# Patient Record
Sex: Female | Born: 1985 | Race: Black or African American | Hispanic: No | Marital: Married | Smoking: Never smoker
Health system: Southern US, Community
[De-identification: ages and names within clinical notes are randomized; demographics above are authoritative.]

## PROBLEM LIST (undated history)

## (undated) DIAGNOSIS — D219 Benign neoplasm of connective and other soft tissue, unspecified: Secondary | ICD-10-CM

## (undated) DIAGNOSIS — D649 Anemia, unspecified: Secondary | ICD-10-CM

## (undated) DIAGNOSIS — O24419 Gestational diabetes mellitus in pregnancy, unspecified control: Secondary | ICD-10-CM

## (undated) DIAGNOSIS — I10 Essential (primary) hypertension: Secondary | ICD-10-CM

## (undated) HISTORY — PX: NO PAST SURGERIES: SHX2092

## (undated) HISTORY — DX: Essential (primary) hypertension: I10

---

## 2021-05-04 ENCOUNTER — Ambulatory Visit (INDEPENDENT_AMBULATORY_CARE_PROVIDER_SITE_OTHER): Payer: Self-pay | Admitting: Obstetrics and Gynecology

## 2021-05-04 ENCOUNTER — Other Ambulatory Visit (HOSPITAL_COMMUNITY)
Admission: RE | Admit: 2021-05-04 | Discharge: 2021-05-04 | Disposition: A | Discharge status: Home / Self Care | Payer: PRIVATE HEALTH INSURANCE | Source: Ambulatory Visit | Attending: Obstetrics and Gynecology | Admitting: Obstetrics and Gynecology

## 2021-05-04 ENCOUNTER — Encounter: Payer: Self-pay | Admitting: Obstetrics and Gynecology

## 2021-05-04 ENCOUNTER — Other Ambulatory Visit: Payer: Self-pay

## 2021-05-04 DIAGNOSIS — Z3480 Encounter for supervision of other normal pregnancy, unspecified trimester: Secondary | ICD-10-CM | POA: Insufficient documentation

## 2021-05-04 DIAGNOSIS — Z3403 Encounter for supervision of normal first pregnancy, third trimester: Secondary | ICD-10-CM | POA: Insufficient documentation

## 2021-05-04 DIAGNOSIS — O99011 Anemia complicating pregnancy, first trimester: Secondary | ICD-10-CM

## 2021-05-04 NOTE — Progress Notes (Signed)
  Subjective:    Caitlin Gutierrez is a G1P0 1w1dbeing seen today for her first obstetrical visit.  Her obstetrical history is significant for first pregnancy. Patient does intend to breast feed. Pregnancy history fully reviewed.  Patient reports no complaints.  Vitals:   05/04/21 0910 05/04/21 0911  BP: 120/81   Pulse: 88   Weight: 177 lb (80.3 kg)   Height:  '5\' 6"'$  (1.676 m)    HISTORY: OB History  Gravida Para Term Preterm AB Living  1            SAB IAB Ectopic Multiple Live Births               # Outcome Date GA Lbr Len/2nd Weight Sex Delivery Anes PTL Lv  1 Current            History reviewed. No pertinent past medical history. Past Surgical History:  Procedure Laterality Date   NO PAST SURGERIES     Family History  Problem Relation Age of Onset   Hypertension Father      Exam    Uterus:     Pelvic Exam:    Perineum: No Hemorrhoids, Normal Perineum   Vulva: normal   Vagina:  normal mucosa, normal discharge   pH:    Cervix: multiparous appearance and closed and long   Adnexa: no mass, fullness, tenderness   Bony Pelvis: gynecoid  System: Breast:  normal appearance, no masses or tenderness   Skin: normal coloration and turgor, no rashes    Neurologic: oriented, no focal deficits   Extremities: normal strength, tone, and muscle mass   HEENT extra ocular movement intact   Mouth/Teeth mucous membranes moist, pharynx normal without lesions and dental hygiene good   Neck supple and no masses   Cardiovascular: regular rate and rhythm   Respiratory:  appears well, vitals normal, no respiratory distress, acyanotic, normal RR, chest clear, no wheezing, crepitations, rhonchi, normal symmetric air entry   Abdomen: soft, non-tender; bowel sounds normal; no masses,  no organomegaly   Urinary:       Assessment:    Pregnancy: G1P0 Patient Active Problem List   Diagnosis Date Noted   Supervision of other normal pregnancy, antepartum 05/04/2021        Plan:      Initial labs drawn. Prenatal vitamins. Problem list reviewed and updated. Genetic Screening discussed : panorama ordered.  Ultrasound discussed; fetal survey: ordered.  Follow up in 4 weeks. 50% of 30 min visit spent on counseling and coordination of care.     Caitlin Gutierrez 05/04/2021

## 2021-05-04 NOTE — Patient Instructions (Signed)
First Trimester of Pregnancy The first trimester of pregnancy starts on the first day of your last menstrual period until the end of week 12. This is months 1 through 3 of pregnancy. A week after a sperm fertilizes an egg, the egg will implant into the wall of the uterus and begin to develop into a baby. By the end of 12 weeks, all the baby's organs will be formed and the baby will be 2-3 inches in size. Body changes during your first trimester Your body goes through many changes during pregnancy. The changes vary and generally return to normal after your baby is born. Physical changes You may gain or lose weight. Your breasts may begin to grow larger and become tender. The tissue that surrounds your nipples (areola) may become darker. Dark spots or blotches (chloasma or mask of pregnancy) may develop on your face. You may have changes in your hair. These can include thickening or thinning of your hair or changes in texture. Health changes You may feel nauseous, and you may vomit. You may have heartburn. You may develop headaches. You may develop constipation. Your gums may bleed and may be sensitive to brushing and flossing. Other changes You may tire easily. You may urinate more often. Your menstrual periods will stop. You may have a loss of appetite. You may develop cravings for certain kinds of food. You may have changes in your emotions from day to day. You may have more vivid and strange dreams. Follow these instructions at home: Medicines Follow your health care provider's instructions regarding medicine use. Specific medicines may be either safe or unsafe to take during pregnancy. Do not take any medicines unless told to by your health care provider. Take a prenatal vitamin that contains at least 600 micrograms (mcg) of folic acid. Eating and drinking Eat a healthy diet that includes fresh fruits and vegetables, whole grains, good sources of protein such as meat, eggs, or tofu,  and low-fat dairy products. Avoid raw meat and unpasteurized juice, milk, and cheese. These carry germs that can harm you and your baby. If you feel nauseous or you vomit: Eat 4 or 5 small meals a day instead of 3 large meals. Try eating a few soda crackers. Drink liquids between meals instead of during meals. You may need to take these actions to prevent or treat constipation: Drink enough fluid to keep your urine pale yellow. Eat foods that are high in fiber, such as beans, whole grains, and fresh fruits and vegetables. Limit foods that are high in fat and processed sugars, such as fried or sweet foods. Activity Exercise only as directed by your health care provider. Most people can continue their usual exercise routine during pregnancy. Try to exercise for 30 minutes at least 5 days a week. Stop exercising if you develop pain or cramping in the lower abdomen or lower back. Avoid exercising if it is very hot or humid or if you are at high altitude. Avoid heavy lifting. If you choose to, you may have sex unless your health care provider tells you not to. Relieving pain and discomfort Wear a good support bra to relieve breast tenderness. Rest with your legs elevated if you have leg cramps or low back pain. If you develop bulging veins (varicose veins) in your legs: Wear support hose as told by your health care provider. Elevate your feet for 15 minutes, 3-4 times a day. Limit salt in your diet. Safety Wear your seat belt at all times when driving  or riding in a car. Talk with your health care provider if someone is verbally or physically abusive to you. Talk with your health care provider if you are feeling sad or have thoughts of hurting yourself. Lifestyle Do not use hot tubs, steam rooms, or saunas. Do not douche. Do not use tampons or scented sanitary pads. Do not use herbal remedies, alcohol, illegal drugs, or medicines that are not approved by your health care provider. Chemicals  in these products can harm your baby. Do not use any products that contain nicotine or tobacco, such as cigarettes, e-cigarettes, and chewing tobacco. If you need help quitting, ask your health care provider. Avoid cat litter boxes and soil used by cats. These carry germs that can cause birth defects in the baby and possibly loss of the unborn baby (fetus) by miscarriage or stillbirth. General instructions During routine prenatal visits in the first trimester, your health care provider will do a physical exam, perform necessary tests, and ask you how things are going. Keep all follow-up visits. This is important. Ask for help if you have counseling or nutritional needs during pregnancy. Your health care provider can offer advice or refer you to specialists for help with various needs. Schedule a dentist appointment. At home, brush your teeth with a soft toothbrush. Floss gently. Write down your questions. Take them to your prenatal visits. Where to find more information American Pregnancy Association: americanpregnancy.[place] and Gynecologists: PoolDevices.com.pt Office on [employer] Health: KeywordPortfolios.com.br Contact a health care provider if you have: Dizziness. A fever. Mild pelvic cramps, pelvic pressure, or nagging pain in the abdominal area. Nausea, vomiting, or diarrhea that lasts for 24 hours or longer. A bad-smelling vaginal discharge. Pain when you urinate. Known exposure to a contagious illness, such as chickenpox, measles, Zika virus, HIV, or hepatitis. Get help right away if you have: Spotting or bleeding from your vagina. Severe abdominal cramping or pain. Shortness of breath or chest pain. Any kind of trauma, such as from a fall or a car crash. New or increased pain, swelling, or redness in an arm or leg. Summary The first trimester of pregnancy starts on the first day of your last menstrual period until the end of week  12 (months 1 through 3). Eating 4 or 5 small meals a day rather than 3 large meals may help to relieve nausea and vomiting. Do not use any products that contain nicotine or tobacco, such as cigarettes, e-cigarettes, and chewing tobacco. If you need help quitting, ask your health care provider. Keep all follow-up visits. This is important. This information is not intended to replace advice given to you by your health care provider. Make sure you discuss any questions you have with your health care provider. Document Revised: 01/16/2020 Document Reviewed: 11/22/2019 Elsevier Patient Education  2022 [place] of Pregnancy The second trimester of pregnancy is from week 13 through week 27. This is months 4 through 6 of pregnancy. The second trimester is often a time when you feel your best. Your body has adjusted to being pregnant, and you begin to feel better physically. During the second trimester: Morning sickness has lessened or stopped completely. You may have more energy. You may have an increase in appetite. The second trimester is also a time when the unborn baby (fetus) is growing rapidly. At the end of the sixth month, the fetus may be up to 12 inches long and weigh about 1 pounds. You will likely begin to  feel the baby move (quickening) between 16 and 20 weeks of pregnancy. Body changes during your second trimester Your body continues to go through many changes during your second trimester. The changes vary and generally return to normal after the baby is born. Physical changes Your weight will continue to increase. You will notice your lower abdomen bulging out. You may begin to get stretch marks on your hips, abdomen, and breasts. Your breasts will continue to grow and to become tender. Dark spots or blotches (chloasma or mask of pregnancy) may develop on your face. A dark line from your belly button to the pubic area (linea nigra) may appear. You may have  changes in your hair. These can include thickening of your hair, rapid growth, and changes in texture. Some people also have hair loss during or after pregnancy, or hair that feels dry or thin. Health changes You may develop headaches. You may have heartburn. You may develop constipation. You may develop hemorrhoids or swollen, bulging veins (varicose veins). Your gums may bleed and may be sensitive to brushing and flossing. You may urinate more often because the fetus is pressing on your bladder. You may have back pain. This is caused by: Weight gain. Pregnancy hormones that are relaxing the joints in your pelvis. A shift in weight and the muscles that support your balance. Follow these instructions at home: Medicines Follow your health care provider's instructions regarding medicine use. Specific medicines may be either safe or unsafe to take during pregnancy. Do not take any medicines unless approved by your health care provider. Take a prenatal vitamin that contains at least 600 micrograms (mcg) of folic acid. Eating and drinking Eat a healthy diet that includes fresh fruits and vegetables, whole grains, good sources of protein such as meat, eggs, or tofu, and low-fat dairy products. Avoid raw meat and unpasteurized juice, milk, and cheese. These carry germs that can harm you and your baby. You may need to take these actions to prevent or treat constipation: Drink enough fluid to keep your urine pale yellow. Eat foods that are high in fiber, such as beans, whole grains, and fresh fruits and vegetables. Limit foods that are high in fat and processed sugars, such as fried or sweet foods. Activity Exercise only as directed by your health care provider. Most people can continue their usual exercise routine during pregnancy. Try to exercise for 30 minutes at least 5 days a week. Stop exercising if you develop contractions in your uterus. Stop exercising if you develop pain or cramping in the  lower abdomen or lower back. Avoid exercising if it is very hot or humid or if you are at a high altitude. Avoid heavy lifting. If you choose to, you may have sex unless your health care provider tells you not to. Relieving pain and discomfort Wear a supportive bra to prevent discomfort from breast tenderness. Take warm sitz baths to soothe any pain or discomfort caused by hemorrhoids. Use hemorrhoid cream if your health care provider approves. Rest with your legs raised (elevated) if you have leg cramps or low back pain. If you develop varicose veins: Wear support hose as told by your health care provider. Elevate your feet for 15 minutes, 3-4 times a day. Limit salt in your diet. Safety Wear your seat belt at all times when driving or riding in a car. Talk with your health care provider if someone is verbally or physically abusive to you. Lifestyle Do not use hot tubs, steam rooms, or saunas.  Do not douche. Do not use tampons or scented sanitary pads. Avoid cat litter boxes and soil used by cats. These carry germs that can cause birth defects in the baby and possibly loss of the fetus by miscarriage or stillbirth. Do not use herbal remedies, alcohol, illegal drugs, or medicines that are not approved by your health care provider. Chemicals in these products can harm your baby. Do not use any products that contain nicotine or tobacco, such as cigarettes, e-cigarettes, and chewing tobacco. If you need help quitting, ask your health care provider. General instructions During a routine prenatal visit, your health care provider will do a physical exam and other tests. He or she will also discuss your overall health. Keep all follow-up visits. This is important. Ask your health care provider for a referral to a local prenatal education class. Ask for help if you have counseling or nutritional needs during pregnancy. Your health care provider can offer advice or refer you to specialists for help  with various needs. Where to find more information American Pregnancy Association: americanpregnancy.[place] and Gynecologists: PoolDevices.com.pt Office on [employer] Health: KeywordPortfolios.com.br Contact a health care provider if you have: A headache that does not go away when you take medicine. Vision changes or you see spots in front of your eyes. Mild pelvic cramps, pelvic pressure, or nagging pain in the abdominal area. Persistent nausea, vomiting, or diarrhea. A bad-smelling vaginal discharge or foul-smelling urine. Pain when you urinate. Sudden or extreme swelling of your face, hands, ankles, feet, or legs. A fever. Get help right away if you: Have fluid leaking from your vagina. Have spotting or bleeding from your vagina. Have severe abdominal cramping or pain. Have difficulty breathing. Have chest pain. Have fainting spells. Have not felt your baby move for the time period told by your health care provider. Have new or increased pain, swelling, or redness in an arm or leg. Summary The second trimester of pregnancy is from week 13 through week 27 (months 4 through 6). Do not use herbal remedies, alcohol, illegal drugs, or medicines that are not approved by your health care provider. Chemicals in these products can harm your baby. Exercise only as directed by your health care provider. Most people can continue their usual exercise routine during pregnancy. Keep all follow-up visits. This is important. This information is not intended to replace advice given to you by your health care provider. Make sure you discuss any questions you have with your health care provider. Document Revised: 01/16/2020 Document Reviewed: 11/22/2019 Elsevier Patient Education  2022 [employer].

## 2021-05-04 NOTE — Progress Notes (Signed)
Pt presents today for initial prenatal visit. No complaints. PHQ 9: 0, GAD 7: 0

## 2021-05-05 LAB — CBC/D/PLT+RPR+RH+ABO+RUBIGG...
Antibody Screen: NEGATIVE
Basophils Absolute: 0 10*3/uL (ref 0.0–0.2)
Basos: 0 %
EOS (ABSOLUTE): 0.1 10*3/uL (ref 0.0–0.4)
Eos: 1 %
HCV Ab: 0.1 s/co ratio (ref 0.0–0.9)
HIV Screen 4th Generation wRfx: NONREACTIVE
Hematocrit: 28.8 % — ABNORMAL LOW (ref 34.0–46.6)
Hemoglobin: 9.8 g/dL — ABNORMAL LOW (ref 11.1–15.9)
Hepatitis B Surface Ag: NEGATIVE
Immature Grans (Abs): 0 10*3/uL (ref 0.0–0.1)
Immature Granulocytes: 0 %
Lymphocytes Absolute: 1.7 10*3/uL (ref 0.7–3.1)
Lymphs: 33 %
MCH: 25.7 pg — ABNORMAL LOW (ref 26.6–33.0)
MCHC: 34 g/dL (ref 31.5–35.7)
MCV: 76 fL — ABNORMAL LOW (ref 79–97)
Monocytes Absolute: 0.6 10*3/uL (ref 0.1–0.9)
Monocytes: 11 %
Neutrophils Absolute: 2.8 10*3/uL (ref 1.4–7.0)
Neutrophils: 55 %
Platelets: 307 10*3/uL (ref 150–450)
RBC: 3.81 x10E6/uL (ref 3.77–5.28)
RDW: 15.5 % — ABNORMAL HIGH (ref 11.7–15.4)
RPR Ser Ql: NONREACTIVE
Rh Factor: POSITIVE
Rubella Antibodies, IGG: 2.91 index (ref >0.99)
WBC: 5.1 10*3/uL (ref 3.4–10.8)

## 2021-05-05 LAB — HCV INTERPRETATION

## 2021-05-06 DIAGNOSIS — O99013 Anemia complicating pregnancy, third trimester: Secondary | ICD-10-CM | POA: Insufficient documentation

## 2021-05-06 DIAGNOSIS — O99011 Anemia complicating pregnancy, first trimester: Secondary | ICD-10-CM | POA: Insufficient documentation

## 2021-05-06 LAB — CULTURE, OB URINE

## 2021-05-06 LAB — CERVICOVAGINAL ANCILLARY ONLY
Chlamydia: NEGATIVE
Comment: NEGATIVE
Comment: NEGATIVE
Comment: NORMAL
Neisseria Gonorrhea: NEGATIVE
Trichomonas: NEGATIVE

## 2021-05-06 LAB — URINE CULTURE, OB REFLEX

## 2021-05-06 MED ORDER — FERROUS SULFATE 325 (65 FE) MG PO TABS: 325.0000 mg | ORAL_TABLET | ORAL | 3 refills | Status: DC

## 2021-05-06 NOTE — Addendum Note (Signed)
Addended by: Verita Schneiders A on: 05/06/2021 11:54 AM   Modules accepted: Orders

## 2021-05-07 LAB — CYTOLOGY - PAP
Adequacy: ABSENT
Comment: NEGATIVE
Diagnosis: NEGATIVE
High risk HPV: NEGATIVE

## 2021-05-07 NOTE — Progress Notes (Signed)
TC to patient to inform of anemia and of RX for iron sent to pharmacy. Patient verbalized understanding.

## 2021-05-11 ENCOUNTER — Encounter: Payer: Self-pay | Admitting: Obstetrics and Gynecology

## 2021-05-18 ENCOUNTER — Encounter: Payer: Self-pay | Admitting: Obstetrics and Gynecology

## 2021-05-19 ENCOUNTER — Telehealth: Payer: Self-pay

## 2021-05-19 NOTE — Telephone Encounter (Signed)
[employer] breast pump form signed and faxed.  Confirmation received.

## 2021-06-02 ENCOUNTER — Other Ambulatory Visit: Payer: Self-pay

## 2021-06-02 ENCOUNTER — Ambulatory Visit (INDEPENDENT_AMBULATORY_CARE_PROVIDER_SITE_OTHER): Payer: PRIVATE HEALTH INSURANCE

## 2021-06-02 VITALS — BP 120/68 | HR 92 | Wt 179.0 lb

## 2021-06-02 DIAGNOSIS — Z23 Encounter for immunization: Secondary | ICD-10-CM

## 2021-06-02 DIAGNOSIS — O26892 Other specified pregnancy related conditions, second trimester: Secondary | ICD-10-CM

## 2021-06-02 DIAGNOSIS — Z3480 Encounter for supervision of other normal pregnancy, unspecified trimester: Secondary | ICD-10-CM

## 2021-06-02 DIAGNOSIS — D563 Thalassemia minor: Secondary | ICD-10-CM

## 2021-06-02 DIAGNOSIS — Z3A17 17 weeks gestation of pregnancy: Secondary | ICD-10-CM

## 2021-06-02 NOTE — Addendum Note (Signed)
Addended by: Tamela Oddi on: 06/02/2021 03:23 PM   Modules accepted: Orders

## 2021-06-02 NOTE — Progress Notes (Signed)
ROB c/o pain in knees and hip at night, spotting in the Mornings.

## 2021-06-02 NOTE — Progress Notes (Signed)
   PRENATAL VISIT NOTE  Subjective:  Caitlin Gutierrez is a 35 y.o. G1P0 at [redacted]w[redacted]d being seen today for ongoing prenatal care.  She is currently monitored for the following issues for this low-risk pregnancy and has Supervision of other normal pregnancy, antepartum and Anemia affecting pregnancy in first trimester on their problem list.  Patient reports bilateral hip and knee pain only at night for the past 2 weeks. Sleeping on her sides makes the pain worse. She has been using knee brace, which helps with knee pain while she is wearing.  Contractions: Not present. Vag. Bleeding: Scant.   . Denies leaking of fluid.   The following portions of the patient's history were reviewed and updated as appropriate: allergies, current medications, past family history, past medical history, past social history, past surgical history and problem list.   Objective:   Vitals:   06/02/21 1346  BP: 120/68  Pulse: 92  Weight: 179 lb (81.2 kg)    Fetal Status: Fetal Heart Rate (bpm): 158         General:  Alert, oriented and cooperative. Patient is in no acute distress.  Skin: Skin is warm and dry. No rash noted.   Cardiovascular: Normal heart rate noted  Respiratory: Normal respiratory effort, no problems with respiration noted  Abdomen: Soft, gravid, appropriate for gestational age.  Pain/Pressure: Absent     Pelvic: Cervical exam deferred        Extremities: Normal range of motion.  Edema: None  Mental Status: Normal mood and affect. Normal behavior. Normal judgment and thought content.   Assessment and Plan:  Pregnancy: G1P0 at [redacted]w[redacted]d 1. Supervision of other normal pregnancy, antepartum - Routine OB care - Reassurance provided on normal discomforts of pregnancy. Recommend continue use of knee brace if it helps. May use ice for knee pain. Support belt, stretching, hydration for hip pain. Tylenol prn. 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 Alpha thalassemia silent carrier   Preterm labor symptoms and general obstetric precautions including but not limited to vaginal bleeding, contractions, leaking of fluid and fetal movement were reviewed in detail with the patient. Please refer to After Visit Summary for other counseling recommendations.   Return in about 4 weeks (around 06/30/2021).  Future Appointments  Date Time Provider Roy  06/15/2021  2:15 PM [facility] [place], CNM 06/02/21 2:43 PM

## 2021-06-04 LAB — AFP, SERUM, OPEN SPINA BIFIDA
AFP MoM: 0.69
AFP Value: 27.8 ng/mL
Gest. Age on Collection Date: 17.2 weeks
Maternal Age At EDD: 34.8 yr
OSBR Risk 1 IN: 10000
Test Results:: NEGATIVE
Weight: 179 [lb_av]

## 2021-06-15 ENCOUNTER — Other Ambulatory Visit: Payer: Self-pay | Admitting: Obstetrics and Gynecology

## 2021-06-15 ENCOUNTER — Ambulatory Visit: Payer: Medicaid Other | Attending: Obstetrics and Gynecology

## 2021-06-15 ENCOUNTER — Other Ambulatory Visit: Payer: Self-pay

## 2021-06-15 DIAGNOSIS — Z3480 Encounter for supervision of other normal pregnancy, unspecified trimester: Secondary | ICD-10-CM

## 2021-06-16 ENCOUNTER — Other Ambulatory Visit: Payer: Self-pay | Admitting: *Deleted

## 2021-06-16 DIAGNOSIS — O3412 Maternal care for benign tumor of corpus uteri, second trimester: Secondary | ICD-10-CM

## 2021-06-16 DIAGNOSIS — Z362 Encounter for other antenatal screening follow-up: Secondary | ICD-10-CM

## 2021-06-17 ENCOUNTER — Encounter (HOSPITAL_COMMUNITY): Payer: Self-pay

## 2021-06-17 ENCOUNTER — Other Ambulatory Visit: Payer: Self-pay

## 2021-06-17 ENCOUNTER — Inpatient Hospital Stay (HOSPITAL_BASED_OUTPATIENT_CLINIC_OR_DEPARTMENT_OTHER): Payer: PRIVATE HEALTH INSURANCE

## 2021-06-17 ENCOUNTER — Encounter (HOSPITAL_COMMUNITY): Payer: Self-pay | Admitting: Obstetrics and Gynecology

## 2021-06-17 ENCOUNTER — Inpatient Hospital Stay (HOSPITAL_COMMUNITY)
Admission: EM | Admit: 2021-06-17 | Discharge: 2021-06-17 | Disposition: A | Discharge status: Home / Self Care | Payer: PRIVATE HEALTH INSURANCE | Attending: Obstetrics and Gynecology | Admitting: Obstetrics and Gynecology

## 2021-06-17 ENCOUNTER — Ambulatory Visit (HOSPITAL_COMMUNITY)
Admission: EM | Admit: 2021-06-17 | Discharge: 2021-06-17 | Disposition: A | Discharge status: Home / Self Care | Payer: Medicaid Other

## 2021-06-17 DIAGNOSIS — O3412 Maternal care for benign tumor of corpus uteri, second trimester: Secondary | ICD-10-CM

## 2021-06-17 DIAGNOSIS — D259 Leiomyoma of uterus, unspecified: Secondary | ICD-10-CM

## 2021-06-17 DIAGNOSIS — O26892 Other specified pregnancy related conditions, second trimester: Secondary | ICD-10-CM

## 2021-06-17 DIAGNOSIS — O3410 Maternal care for benign tumor of corpus uteri, unspecified trimester: Secondary | ICD-10-CM | POA: Diagnosis not present

## 2021-06-17 DIAGNOSIS — Z3A19 19 weeks gestation of pregnancy: Secondary | ICD-10-CM | POA: Diagnosis not present

## 2021-06-17 DIAGNOSIS — O99891 Other specified diseases and conditions complicating pregnancy: Secondary | ICD-10-CM

## 2021-06-17 DIAGNOSIS — R109 Unspecified abdominal pain: Secondary | ICD-10-CM

## 2021-06-17 DIAGNOSIS — O26899 Other specified pregnancy related conditions, unspecified trimester: Secondary | ICD-10-CM

## 2021-06-17 DIAGNOSIS — D573 Sickle-cell trait: Secondary | ICD-10-CM

## 2021-06-17 DIAGNOSIS — O99011 Anemia complicating pregnancy, first trimester: Secondary | ICD-10-CM

## 2021-06-17 DIAGNOSIS — Z3480 Encounter for supervision of other normal pregnancy, unspecified trimester: Secondary | ICD-10-CM

## 2021-06-17 LAB — CBC
HCT: 32.5 % — ABNORMAL LOW (ref 36.0–46.0)
Hemoglobin: 10.8 g/dL — ABNORMAL LOW (ref 12.0–15.0)
MCH: 27.1 pg (ref 26.0–34.0)
MCHC: 33.2 g/dL (ref 30.0–36.0)
MCV: 81.5 fL (ref 80.0–100.0)
Platelets: 264 10*3/uL (ref 150–400)
RBC: 3.99 MIL/uL (ref 3.87–5.11)
RDW: 14.6 % (ref 11.5–15.5)
WBC: 8.3 10*3/uL (ref 4.0–10.5)
nRBC: 0 % (ref 0.0–0.2)

## 2021-06-17 LAB — URINALYSIS, ROUTINE W REFLEX MICROSCOPIC
Bilirubin Urine: NEGATIVE
Glucose, UA: NEGATIVE mg/dL
Hgb urine dipstick: NEGATIVE
Ketones, ur: 20 mg/dL — AB
Leukocytes,Ua: NEGATIVE
Nitrite: NEGATIVE
Protein, ur: NEGATIVE mg/dL
Specific Gravity, Urine: 1.01 (ref 1.005–1.030)
pH: 6 (ref 5.0–8.0)

## 2021-06-17 LAB — WET PREP, GENITAL
Clue Cells Wet Prep HPF POC: NONE SEEN
Sperm: NONE SEEN
Trich, Wet Prep: NONE SEEN
Yeast Wet Prep HPF POC: NONE SEEN

## 2021-06-17 MED ORDER — ACETAMINOPHEN 500 MG PO TABS
1000.0000 mg | ORAL_TABLET | Freq: Four times a day (QID) | ORAL | Status: DC | PRN
  Administered 2021-06-17: 1000 mg via ORAL
  Filled 2021-06-17: qty 2

## 2021-06-17 MED ORDER — ACETAMINOPHEN 500 MG PO TABS: 1000.0000 mg | ORAL_TABLET | Freq: Four times a day (QID) | ORAL | 0 refills | Status: DC | PRN

## 2021-06-17 NOTE — ED Notes (Signed)
Report called to [facility], [facility] and transport called.

## 2021-06-17 NOTE — ED Provider Notes (Signed)
Emergency Medicine Provider OB Triage Evaluation Note  Caitlin Gutierrez is a 35 y.o. female, G1P0, at [redacted]w[redacted]d gestation who presents to the emergency department with complaints of left-sided abdominal pain onset yesterday, continues into today.  Pain is, not associate with nausea, vomiting, changes in bowel or bladder habits.  Denies vaginal bleeding or leaking fluid.  Went to urgent care for this today and was sent to [facility] however came to the emergency room for screening.  Review of  Systems  Positive: Abdominal pain Negative: Fever, nausea, vomiting, abdominal  Physical Exam  BP 122/82 (BP Location: Right Arm)   Pulse 92   Temp 98 F (36.7 C) (Oral)   Resp 20   LMP 02/01/2021   SpO2 100%  General: Awake, no distress, tearful HEENT: Atraumatic  Resp: Normal effort  Cardiac: Normal rate Abd: Gravid, left-sided tenderness MSK: Moves all extremities without difficulty Neuro: Speech clear  Medical Decision Making  Pt evaluated for pregnancy concern and is stable for transfer to [facility]. Pt is in agreement with plan for transfer.  2:38 PM Discussed with [facility]U APP, Threasa Beards, who accepts patient in transfer.  Clinical Impression   1. Abdominal pain during pregnancy in second trimester        Tacy Learn, PA-C 06/17/21 1438    Pattricia Boss, MD 06/17/21 [phone]

## 2021-06-17 NOTE — ED Notes (Signed)
Patient is being discharged from the Urgent Care and sent to the Emergency Department via POV . Per PA, patient is in need of higher level of care due to further evaluaiton. Patient is aware and verbalizes understanding of plan of care.  Vitals:   06/17/21 1203  BP: 129/84  Pulse: 85  Resp: 18  Temp: 98.3 F (36.8 C)  SpO2: 100%

## 2021-06-17 NOTE — ED Triage Notes (Signed)
Pt c/o LLQ pain x2 days. Denies n/v/d. Pt states [redacted]wks pregnant. Denies vaginal bleeding or urinary difficulty.

## 2021-06-17 NOTE — ED Provider Notes (Signed)
[facility]    CSN: [id number] Arrival date & time: 06/17/21  1043      History   Chief Complaint Chief Complaint  Patient presents with   Abdominal Pain    HPI Caitlin Gutierrez is a 35 y.o. female.   ShePatient presents today with a 2-day history of worsening left lower quadrant abdominal pain.  Has any nausea, vomiting, diarrhea, changes in bowel habits, melena, hematochezia.  Reports last bowel movement was earlier today and was normal.  Pain is rated 10 on a 0-10 pain scale, localized to left abdomen, described as sharp, no aggravating relieving factors identified.  She has not tried any over-the-counter medication for symptom management.  Denies history of gastrointestinal disorder including ulcerative colitis or Crohn's disease.  She denies any pelvic pain.  Reports baby is still active.  Denies any unusual food intake, recent travel.  Denies any urinary symptoms including frequency, urgency, dysuria.   History reviewed. No pertinent past medical history.  Patient Active Problem List   Diagnosis Date Noted   Anemia affecting pregnancy in first trimester 05/06/2021   Supervision of other normal pregnancy, antepartum 05/04/2021    Past Surgical History:  Procedure Laterality Date   NO PAST SURGERIES      OB History     Gravida  1   Para      Term      Preterm      AB      Living         SAB      IAB      Ectopic      Multiple      Live Births               Home Medications    Prior to Admission medications   Medication Sig Start Date End Date Taking? Authorizing Provider  ferrous sulfate (FERROUSUL) 325 (65 FE) MG tablet Take 1 tablet (325 mg total) by mouth every other day. 05/06/21   Anyanwu, Sallyanne Havers, MD  Prenat-Fe Carbonyl-FA-Omega 3 (ONE-A-DAY WOMENS PRENATAL 1) 28-0.8-235 MG CAPS Take by mouth.    [provider]    Family History Family History  Problem Relation Age of Onset   Hypertension Father     Social  History Social History   Tobacco Use   Smoking status: Never   Smokeless tobacco: Never  Vaping Use   Vaping Use: Never used  Substance Use Topics   Alcohol use: Not Currently   Drug use: Never     Allergies   Patient has no known allergies.   Review of Systems Review of Systems  Constitutional:  Positive for activity change and appetite change. Negative for fatigue and fever.  Respiratory:  Negative for cough and shortness of breath.   Cardiovascular:  Negative for chest pain.  Gastrointestinal:  Positive for abdominal pain. Negative for blood in stool, diarrhea, nausea and vomiting.  Genitourinary:  Negative for dysuria, frequency, urgency, vaginal bleeding, vaginal discharge and vaginal pain.  Neurological:  Negative for dizziness, light-headedness and headaches.    Physical Exam Triage Vital Signs ED Triage Vitals  Enc Vitals Group     BP 06/17/21 1203 129/84     Pulse Rate 06/17/21 1203 85     Resp 06/17/21 1203 18     Temp 06/17/21 1203 98.3 F (36.8 C)     Temp Source 06/17/21 1203 Oral     SpO2 06/17/21 1203 100 %     Weight --  Height --      Head Circumference --      Peak Flow --      Pain Score 06/17/21 1205 10     Pain Loc --      Pain Edu? --      Excl. in [place]? --    No data found.  Updated Vital Signs BP 129/84 (BP Location: Right Arm)   Pulse 85   Temp 98.3 F (36.8 C) (Oral)   Resp 18   LMP 02/01/2021   SpO2 100%   Visual Acuity Right Eye Distance:   Left Eye Distance:   Bilateral Distance:    Right Eye Near:   Left Eye Near:    Bilateral Near:     Physical Exam Vitals reviewed.  Constitutional:      General: She is awake. She is not in acute distress.    Appearance: Normal appearance. She is well-developed. She is not ill-appearing.  HENT:     Head: Normocephalic and atraumatic.  Cardiovascular:     Rate and Rhythm: Normal rate and regular rhythm.     Heart sounds: S1 normal and S2 normal. Murmur heard.  Pulmonary:      Effort: Pulmonary effort is normal.     Breath sounds: Normal breath sounds. No wheezing, rhonchi or rales.     Comments: Clear to auscultation bilaterally Abdominal:     General: Bowel sounds are normal.     Palpations: Abdomen is soft.     Tenderness: There is abdominal tenderness in the left upper quadrant and left lower quadrant. There is guarding. There is no right CVA tenderness, left CVA tenderness or rebound.     Comments: Significant tenderness palpation over left abdomen with associated guarding.  Psychiatric:        Behavior: Behavior is cooperative.     UC Treatments / Results  Labs (all labs ordered are listed, but only abnormal results are displayed) Labs Reviewed - No data to display  EKG   Radiology [place] MFM OB DETAIL +14 WK  Result Date: 06/15/2021 ----------------------------------------------------------------------  OBSTETRICS REPORT                       (Signed Final 06/15/2021 05:01 pm) ---------------------------------------------------------------------- Patient Info  ID #:       [id number]                          D.O.B.:  1986-05-06 (35 yrs)  Name:       Caitlin Gutierrez                   Visit Date: 06/15/2021 02:15 pm ---------------------------------------------------------------------- Performed By  Attending:        Tama High MD        Secondary Phy.:   [facility]  Performed By:     Jeanene Erb BS,      Address:          [address]  [address]  Referred By:      Vickii Chafe                  [facility]                              [facility]                                                             [facility]                                                              [facility]  Ref. Address:     Faculty ---------------------------------------------------------------------- Orders  #  Description                           Code        Ordered By  1  [place] MFM OB DETAIL +14 Hernandez               [id number]    [place] ----------------------------------------------------------------------  #  Order #                     Accession #                Episode #  1  [id number]                   [id number]                 [id number] ---------------------------------------------------------------------- Indications  Encounter for antenatal screening for          Z36.3  malformations  [redacted] weeks gestation of pregnancy                Z3A.19  Anemia during pregnancy in second trimester    O99.012  Genetic carrier (Alpha-Thalassemia)            Z14.8  Low Risk NIPS(Negative AFP)  Uterine fibroids affecting pregnancy in        O34.12, D25.9  second trimester, antepartum  History of  sickle cell trait                   Z86.2 ---------------------------------------------------------------------- Fetal Evaluation  Num Of Fetuses:         1  Fetal Heart Rate(bpm):  160  Cardiac Activity:       Observed  Presentation:           Cephalic  Placenta:               Anterior  P. Cord Insertion:      Visualized  Amniotic Fluid  AFI FV:      Within normal limits                              Largest Pocket(cm)                              6.3 ---------------------------------------------------------------------- Biometry  BPD:        47  mm     G. Age:  20w 1d         88  %    CI:         77.5   %    70 - 86                                                          FL/HC:      18.7   %    16.1 - 18.3  HC:       169   mm     G. Age:  19w 4d         62  %    HC/AC:      1.12        1.09 - 1.39  AC:      151.1  mm     G. Age:  20w 2d         82  %    FL/BPD:     67.2   %  FL:       31.6  mm     G. Age:  19w 6d         68  %    FL/AC:      20.9   %    20 - 24  CER:      20.3   mm     G. Age:  19w 4d         60  %  NFT:       3.4  mm  LV:        6.4  mm  CM:        4.3  mm  Est. FW:     329  gm    0 lb 12 oz      91  % ---------------------------------------------------------------------- OB History  Gravidity:    1 ---------------------------------------------------------------------- Gestational Age  LMP:           19w 1d        Date:  02/01/21                 EDD:   11/08/21  U/S Today:     20w 0d  EDD:   11/02/21  Best:          19w 1d     Det. By:  LMP  (02/01/21)          EDD:   11/08/21 ---------------------------------------------------------------------- Anatomy  Cranium:               Appears normal         LVOT:                   Not well visualized  Cavum:                 Appears normal         Aortic Arch:            Appears normal  Ventricles:            Appears normal         Ductal Arch:            Appears normal  Choroid Plexus:        Appears normal         Diaphragm:              Appears normal  Cerebellum:            Appears normal         Stomach:                Appears normal, left                                                                        sided  Posterior Fossa:       Appears normal         Abdomen:                Appears normal  Nuchal Fold:           Appears normal         Abdominal Wall:         Appears nml (cord                                                                        insert, abd wall)  Face:                  Not well visualized    Cord Vessels:           Appears normal (3                                                                        vessel cord)  Lips:  Appears normal         Kidneys:                Appear normal  Palate:                Not well visualized    Bladder:                Appears normal  Thoracic:              Appears normal         Spine:                  Appears normal  Heart:                 Appears normal         Upper Extremities:      Appears normal                          (4CH, axis, and                         situs)  RVOT:                  Not well visualized    Lower Extremities:      Appears normal  Other:  Fetus appears to be a female. Technically difficult due to maternal          habitus and fetal position. ---------------------------------------------------------------------- Cervix Uterus Adnexa  Cervix  Length:           3.48  cm.  Normal appearance by transabdominal scan.  Adnexa  No abnormality visualized. ---------------------------------------------------------------------- Myomas  Site                     L(cm)      W(cm)      D(cm)       Location  Right                    6.8        5.7        4           Intramural  Site                     L(cm)      W(cm)      D(cm)       Location  Right                    2          1.8        2.2         Intramural  Left                     5.6        4.7        2.8         Intramural ----------------------------------------------------------------------  Blood Flow                  RI       PI       Comments ---------------------------------------------------------------------- Impression  G1 P0. Patient is here for fetal anatomy scan.  On cell-free fetal DNA screening, the risks of fetal  aneuploidies are not increased .MSAFP screening showed  low risk for  open-neural tube defects .  We performed fetal anatomy scan. No makers of  aneuploidies or fetal structural defects are seen. Fetal  biometry is consistent with her previously-established dates.  Amniotic fluid is normal and good fetal activity is seen.  Patient understands the limitations of ultrasound in detecting  fetal anomalies.  Multiple myomas are seen (measurements  above).  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 I discussed the genetics and the  likelihood of having a fetus affected with sickle-cell disease if  her partner has sickle cell trait (1 in 4).  I discussed and recommended partner screening. He  agreed  to screen.  Blood was drawn today (partner) and sent over to the lab.  We will communicate the results to the couple. ---------------------------------------------------------------------- Recommendations  -An appointment was made for her to return in 4 weeks for  completion of fetal anatomy. ----------------------------------------------------------------------                  Tama High, MD Electronically Signed Final Report   06/15/2021 05:01 pm ----------------------------------------------------------------------   Procedures Procedures (including critical care time)  Medications Ordered in UC Medications - No data to display  Initial Impression / Assessment and Plan / UC Course  I have reviewed the triage vital signs and the nursing notes.  Pertinent labs & imaging results that were available during my care of the patient were reviewed by me and considered in my medical decision making (see chart for details).     Given severity of abdominal pain in setting of pregnancy discussed need for further evaluation including imaging.  We do not have imaging capabilities in urgent care so patient was sent to MAU.  Significant other will drive her following clinic visit today.  Vital signs are stable at the time of discharge and she is safe for private transport.  Patient is agreeable to plan will go directly to MAU for further evaluation and management.  Final Clinical Impressions(s) / UC Diagnoses   Final diagnoses:  Left sided abdominal pain  [redacted] weeks gestation of pregnancy     Discharge Instructions      Go directly to the MAU for further evaluation and management     ED Prescriptions   None    PDMP not reviewed this encounter.   Terrilee Croak, PA-C 06/17/21 1214

## 2021-06-17 NOTE — MAU Note (Addendum)
Presents with c/o sharp intermittent pain in LLQ that began 2 days ago.  Denies VB or LOF.  Hasn't taken any meds to relieve pain.

## 2021-06-17 NOTE — MAU Provider Note (Signed)
History     CSN: [id number]  Arrival date and time: 06/17/21 1227   Event Date/Time   First Provider Initiated Contact with Patient 06/17/21 1611      Chief Complaint  Patient presents with   Abdominal Pain   35 y.o. G1 @19 .3 wks presenting with LLQ pain. Reports onset 2 days ago. Describes as sharp and intermittent. Rates pain 10/10. Has not treated it. Denies urinary sx. Denies GI sx. Last BM this am was normal. Denies fevers. Denies Vb or discharge. Reports +FM.    OB History     Gravida  1   Para      Term      Preterm      AB      Living         SAB      IAB      Ectopic      Multiple      Live Births              History reviewed. No pertinent past medical history.  Past Surgical History:  Procedure Laterality Date   NO PAST SURGERIES      Family History  Problem Relation Age of Onset   Hypertension Father     Social History   Tobacco Use   Smoking status: Never   Smokeless tobacco: Never  Vaping Use   Vaping Use: Never used  Substance Use Topics   Alcohol use: Not Currently   Drug use: Never    Allergies: No Known Allergies  Medications Prior to Admission  Medication Sig Dispense Refill Last Dose   ferrous sulfate (FERROUSUL) 325 (65 FE) MG tablet Take 1 tablet (325 mg total) by mouth every other day. 30 tablet 3    Prenat-Fe Carbonyl-FA-Omega 3 (ONE-A-DAY WOMENS PRENATAL 1) 28-0.8-235 MG CAPS Take by mouth.       Review of Systems  Constitutional:  Negative for fever.  Gastrointestinal:  Positive for abdominal pain. Negative for constipation, diarrhea, nausea and vomiting.  Genitourinary:  Negative for dysuria, frequency, hematuria, urgency, vaginal bleeding and vaginal discharge.  Physical Exam   Blood pressure 126/73, pulse 81, temperature (!) 97.5 F (36.4 C), temperature source Oral, resp. rate 18, last menstrual period 02/01/2021, SpO2 100 %.  Physical Exam Vitals and nursing note reviewed. Exam conducted with a  chaperone present.  Constitutional:      General: She is not in acute distress.    Appearance: Normal appearance.  HENT:     Head: Normocephalic and atraumatic.  Cardiovascular:     Rate and Rhythm: Normal rate.  Pulmonary:     Effort: Pulmonary effort is normal. No respiratory distress.  Abdominal:     Palpations: Abdomen is soft. There is mass (?fibroids).     Tenderness: There is abdominal tenderness. There is no guarding.     Comments: Uterus 3-4 above U on left and 1-2 above U on right   Genitourinary:    Comments: SVE: closed/thick Musculoskeletal:        General: Normal range of motion.     Cervical back: Normal range of motion.  Skin:    General: Skin is warm and dry.  Neurological:     General: No focal deficit present.     Mental Status: She is alert and oriented to person, place, and time.  Psychiatric:        Mood and Affect: Mood normal.        Behavior: Behavior normal.  FHT 146  Results for orders placed or performed during the hospital encounter of 06/17/21 (from the past 24 hour(s))  Wet prep, genital     Status: Abnormal   Collection Time: 06/17/21  3:41 PM  Result Value Ref Range   Yeast Wet Prep HPF POC NONE SEEN NONE SEEN   Trich, Wet Prep NONE SEEN NONE SEEN   Clue Cells Wet Prep HPF POC NONE SEEN NONE SEEN   WBC, Wet Prep HPF POC FEW (A) NONE SEEN   Sperm NONE SEEN   CBC     Status: Abnormal   Collection Time: 06/17/21  4:34 PM  Result Value Ref Range   WBC 8.3 4.0 - 10.5 K/uL   RBC 3.99 3.87 - 5.11 MIL/uL   Hemoglobin 10.8 (L) 12.0 - 15.0 g/dL   HCT 32.5 (L) 36.0 - 46.0 %   MCV 81.5 80.0 - 100.0 fL   MCH 27.1 26.0 - 34.0 pg   MCHC 33.2 30.0 - 36.0 g/dL   RDW 14.6 11.5 - 15.5 %   Platelets 264 150 - 400 K/uL   nRBC 0.0 0.0 - 0.2 %  Urinalysis, Routine w reflex microscopic     Status: Abnormal   Collection Time: 06/17/21  5:26 PM  Result Value Ref Range   Color, Urine YELLOW YELLOW   APPearance CLEAR CLEAR   Specific Gravity, Urine 1.010  1.005 - 1.030   pH 6.0 5.0 - 8.0   Glucose, UA NEGATIVE NEGATIVE mg/dL   Hgb urine dipstick NEGATIVE NEGATIVE   Bilirubin Urine NEGATIVE NEGATIVE   Ketones, ur 20 (A) NEGATIVE mg/dL   Protein, ur NEGATIVE NEGATIVE mg/dL   Nitrite NEGATIVE NEGATIVE   Leukocytes,Ua NEGATIVE NEGATIVE   Media Information    MAU Course  Procedures Tylenol  MDM Labs and [place] ordered and reviewed. [place] confirms multiple large uterine fibroids on left side, likely source of pain, pt and partner informed.  Pain improved with Tylenol. Stable for discharge home.   Assessment and Plan  [redacted] weeks gestation Uterine fibroids Abdominal pain  Discharge home Follow [facility] as scheduled Rx Tylenol prn SAB precautions  Allergies as of 06/17/2021   No Known Allergies      Medication List     TAKE these medications    acetaminophen 500 MG tablet Commonly known as: TYLENOL Take 2 tablets (1,000 mg total) by mouth every 6 (six) hours as needed for moderate pain.   ferrous sulfate 325 (65 FE) MG tablet Commonly known as: FerrouSul Take 1 tablet (325 mg total) by mouth every other day.   One-A-Day Womens Prenatal 1 28-0.8-235 MG Caps Take by mouth.        Julianne Handler, CNM 06/17/2021, 6:37 PM

## 2021-06-17 NOTE — Discharge Instructions (Addendum)
Go directly to the MAU for further evaluation and management

## 2021-06-18 ENCOUNTER — Ambulatory Visit (HOSPITAL_COMMUNITY): Payer: PRIVATE HEALTH INSURANCE

## 2021-06-18 LAB — GC/CHLAMYDIA PROBE AMP (~~LOC~~) NOT AT ARMC
Chlamydia: NEGATIVE
Comment: NEGATIVE
Comment: NORMAL
Neisseria Gonorrhea: NEGATIVE

## 2021-06-30 ENCOUNTER — Other Ambulatory Visit: Payer: Self-pay

## 2021-06-30 ENCOUNTER — Ambulatory Visit (INDEPENDENT_AMBULATORY_CARE_PROVIDER_SITE_OTHER): Payer: Medicaid Other | Admitting: Advanced Practice Midwife

## 2021-06-30 VITALS — BP 123/71 | HR 102 | Wt 183.6 lb

## 2021-06-30 DIAGNOSIS — D259 Leiomyoma of uterus, unspecified: Secondary | ICD-10-CM

## 2021-06-30 DIAGNOSIS — Z3480 Encounter for supervision of other normal pregnancy, unspecified trimester: Secondary | ICD-10-CM

## 2021-06-30 DIAGNOSIS — Z3A21 21 weeks gestation of pregnancy: Secondary | ICD-10-CM

## 2021-06-30 DIAGNOSIS — O3412 Maternal care for benign tumor of corpus uteri, second trimester: Secondary | ICD-10-CM

## 2021-06-30 NOTE — Progress Notes (Signed)
   PRENATAL VISIT NOTE  Subjective:  Caitlin Gutierrez is a 35 y.o. G1P0 at [redacted]w[redacted]d being seen today for ongoing prenatal care.  She is currently monitored for the following issues for this low-risk pregnancy and has Supervision of other normal pregnancy, antepartum; Anemia affecting pregnancy in first trimester; Sickle cell trait ([place]); and Uterine fibroids affecting pregnancy on their problem list.  Patient reports no complaints.  Contractions: Not present. Vag. Bleeding: None.  Movement: Present. Denies leaking of fluid.   The following portions of the patient's history were reviewed and updated as appropriate: allergies, current medications, past family history, past medical history, past social history, past surgical history and problem list.   Objective:   Vitals:   06/30/21 1528  BP: 123/71  Pulse: (!) 102  Weight: 183 lb 9.6 oz (83.3 kg)    Fetal Status: Fetal Heart Rate (bpm): 155   Movement: Present     General:  Alert, oriented and cooperative. Patient is in no acute distress.  Skin: Skin is warm and dry. No rash noted.   Cardiovascular: Normal heart rate noted  Respiratory: Normal respiratory effort, no problems with respiration noted  Abdomen: Soft, gravid, appropriate for gestational age.  Pain/Pressure: Absent     Pelvic: Cervical exam deferred        Extremities: Normal range of motion.  Edema: None  Mental Status: Normal mood and affect. Normal behavior. Normal judgment and thought content.   Assessment and Plan:  Pregnancy: G1P0 at 108w2d 1. Supervision of other normal pregnancy, antepartum --Anticipatory guidance about next visits/weeks of pregnancy given. --Reviewed pt normal anatomy US, follow up to complete scheduled with MFM --Next visit in 5 weeks for GTT  2. Leiomyoma of uterus affecting pregnancy in second trimester --No cramping today, MAU visit on 10/24 with pain, resolved.  3. [redacted] weeks gestation of pregnancy   Preterm labor symptoms and general  obstetric precautions including but not limited to vaginal bleeding, contractions, leaking of fluid and fetal movement were reviewed in detail with the patient. Please refer to After Visit Summary for other counseling recommendations.   No follow-ups on file.  Future Appointments  Date Time Provider [place]  07/21/2021  3:00 PM [facility] NURSE [facility]  07/21/2021  3:15 PM [facility] US[facility] [place]    Fatima Blank, CNM

## 2021-06-30 NOTE — Progress Notes (Signed)
Patient presents for ROB. Patient complains of having mild pain on her left side. No other concerns.

## 2021-07-09 ENCOUNTER — Ambulatory Visit: Payer: Medicaid Other

## 2021-07-10 ENCOUNTER — Ambulatory Visit: Payer: Medicaid Other | Attending: Obstetrics and Gynecology

## 2021-07-10 ENCOUNTER — Other Ambulatory Visit: Payer: Self-pay

## 2021-07-10 DIAGNOSIS — D573 Sickle-cell trait: Secondary | ICD-10-CM

## 2021-07-10 DIAGNOSIS — D563 Thalassemia minor: Secondary | ICD-10-CM | POA: Diagnosis not present

## 2021-07-10 HISTORY — DX: Thalassemia minor: D56.3

## 2021-07-10 NOTE — Progress Notes (Signed)
Name: Caitlin Gutierrez Indication: Discuss carrier screening results  DOB: [birth year] Age: 35 y.o.   EDC: 11/08/2021 LMP: 02/01/2021 Referring Provider: Mora Bellman, MD  EGA: [redacted]w[redacted]d Genetic Counselor: Caitlin Righter, MS, CGC  OB Hx: G1P0 Date of Appointment: 07/10/2021  Accompanied by: Father of the pregnancy Caitlin Gutierrez) Face to Face Time: 30 Minutes   Medical History:  Reports she takes One a Day Prenatal vitamins, Ferrous sulfate, and Acetaminophen. Denies personal history of diabetes, high blood pressure, thyroid conditions, and seizures. Denies bleeding, infections, and fevers in this pregnancy. Denies using tobacco, alcohol, or street drugs in this pregnancy.   Family History: A pedigree was created and scanned into Epic under the Media tab. Caitlin Gutierrez reports she has 5 living, healthy siblings.  Caitlin Gutierrez reports he has 4 living, healthy siblings.  Caitlin Gutierrez and Caitlin Gutierrez report their parents are all living and doing well for their ages.  Maternal ethnicity reported as Black and paternal ethnicity reported as Black. Denies Ashkenazi Jewish ancestry. Family history not remarkable for consanguinity, individuals with birth defects, intellectual disability, autism spectrum disorder, mental illness, multiple spontaneous abortions, still births, or unexplained neonatal death.     Genetic Counseling:   Discuss Alpha Thalassemia Carrier Screening Results. Caitlin Gutierrez and Caitlin Gutierrez are both silent carriers for alpha thalassemia (??/?-). 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 Genetic counseling reviewed with the couple that they both have three working copies of the alpha-globin genes while their 4th alpha-globin gene is deleted. Caitlin Gutierrez will either pass on two functional copies or one functional copy with one deletion to her offspring. Caitlin Gutierrez will either pass on two functional copies or one functional copy with one deletion to his offspring. There is zero risk for  Caitlin Gutierrez and Caitlin Gutierrez's children together to be affected with Hemoglobin Bart's disease (--/--) or Hemoglobin H disease (--/?-). Genetic counseling reviewed the following punnett square with the couple:            ??           ?-         ??             ??/??      Non-carrier        ??/?-    Silent carrier          ?-        ??/?-     Silent carrier         ?-/?-    Trans carrier   25% chance for their children together to be non-carriers (??/??) 50% chance for their children together to be silent carriers (??/?-) 25% chance for their children together to be carriers in the trans configuration (?-/?-)  We reviewed that each of Caitlin Gutierrez and Caitlin Gutierrez's siblings have a 50% chance to be silent carriers for alpha thalassemia. Genetic counseling encouraged them to share this information with their siblings.  Other Carrier Screening Results. Caitlin Gutierrez screened to not be a carrier for Cystic Fibrosis and Spinal Muscular Atrophy. She screened to be a carrier of Sickle Cell Disease (Hb A/S), however, Caitlin Gutierrez screened to NOT be a carrier for Sickle Cell Disease and other Beta-Hemoglobinopathies. A negative result on carrier screening reduces the likelihood of being a carrier, however, does not entirely rule out the possibility. There is a 50% chance Caitlin Gutierrez's siblings are also carriers for Sickle Cell Disease. Genetic counseling encouraged Caitlin Gutierrez to share this information with her siblings.  Risk for Aneuploidy. Caitlin Gutierrez previously completed Non-Invasive Prenatal  Screening (NIPS) in this pregnancy. The result is low risk which significantly reduces the chance that the current pregnancy has Down syndrome, Trisomy 4, Trisomy 6, or common sex chromosome aneuploidies. A low-risk result does not ensure an unaffected pregnancy and NIPS does not screen for neural tube defects or other genetic conditions.  Risk for Open Neural Tube Defects (ONTD). Caitlin Gutierrez previously completed a maternal serum AFP screen in this  pregnancy. The result is screen negative which reduces the chance that the current pregnancy has an ONTD.   Thank you for sharing in the care of Caitlin Gutierrez with [place].  Please do not hesitate to contact us if you have any questions.  Caitlin Righter, MS, [facility]

## 2021-07-21 ENCOUNTER — Encounter: Payer: Self-pay | Admitting: *Deleted

## 2021-07-21 ENCOUNTER — Other Ambulatory Visit: Payer: Self-pay

## 2021-07-21 ENCOUNTER — Ambulatory Visit: Payer: Medicaid Other | Admitting: *Deleted

## 2021-07-21 ENCOUNTER — Ambulatory Visit: Payer: Medicaid Other | Attending: Obstetrics and Gynecology

## 2021-07-21 VITALS — BP 130/69 | HR 81

## 2021-07-21 DIAGNOSIS — O99012 Anemia complicating pregnancy, second trimester: Secondary | ICD-10-CM | POA: Insufficient documentation

## 2021-07-21 DIAGNOSIS — Z3480 Encounter for supervision of other normal pregnancy, unspecified trimester: Secondary | ICD-10-CM

## 2021-07-21 DIAGNOSIS — O3412 Maternal care for benign tumor of corpus uteri, second trimester: Secondary | ICD-10-CM | POA: Diagnosis present

## 2021-07-21 DIAGNOSIS — D573 Sickle-cell trait: Secondary | ICD-10-CM

## 2021-07-21 DIAGNOSIS — Z862 Personal history of diseases of the blood and blood-forming organs and certain disorders involving the immune mechanism: Secondary | ICD-10-CM | POA: Insufficient documentation

## 2021-07-21 DIAGNOSIS — D259 Leiomyoma of uterus, unspecified: Secondary | ICD-10-CM

## 2021-07-21 DIAGNOSIS — Z148 Genetic carrier of other disease: Secondary | ICD-10-CM | POA: Diagnosis not present

## 2021-07-21 DIAGNOSIS — O99011 Anemia complicating pregnancy, first trimester: Secondary | ICD-10-CM | POA: Diagnosis present

## 2021-07-21 DIAGNOSIS — Z362 Encounter for other antenatal screening follow-up: Secondary | ICD-10-CM | POA: Diagnosis present

## 2021-07-21 DIAGNOSIS — Z3A24 24 weeks gestation of pregnancy: Secondary | ICD-10-CM | POA: Insufficient documentation

## 2021-07-22 ENCOUNTER — Other Ambulatory Visit: Payer: Self-pay | Admitting: *Deleted

## 2021-07-22 DIAGNOSIS — O3412 Maternal care for benign tumor of corpus uteri, second trimester: Secondary | ICD-10-CM

## 2021-07-22 DIAGNOSIS — D259 Leiomyoma of uterus, unspecified: Secondary | ICD-10-CM

## 2021-07-27 ENCOUNTER — Encounter: Payer: Self-pay | Admitting: Obstetrics and Gynecology

## 2021-07-27 ENCOUNTER — Ambulatory Visit (INDEPENDENT_AMBULATORY_CARE_PROVIDER_SITE_OTHER): Payer: Medicaid Other | Admitting: Obstetrics and Gynecology

## 2021-07-27 ENCOUNTER — Other Ambulatory Visit: Payer: Medicaid Other

## 2021-07-27 ENCOUNTER — Other Ambulatory Visit: Payer: Self-pay

## 2021-07-27 VITALS — BP 123/75 | HR 87 | Wt 186.0 lb

## 2021-07-27 DIAGNOSIS — Z3480 Encounter for supervision of other normal pregnancy, unspecified trimester: Secondary | ICD-10-CM

## 2021-07-27 DIAGNOSIS — D563 Thalassemia minor: Secondary | ICD-10-CM

## 2021-07-27 DIAGNOSIS — O99011 Anemia complicating pregnancy, first trimester: Secondary | ICD-10-CM

## 2021-07-27 NOTE — Patient Instructions (Signed)
DOULA LIST   Beautiful Beginnings Doula  [place]  [phone]  [email or web address]  [email or web address]  Zula the [place] [phone]  [email or web address]   Precious [place]   [email or web address]   ??THE MOTHERLY DOULA?? Lajuana Ripple   [phone]   [email or web address]     The Abundant Life Doula  Mattie Marlin  [phone]    [email or web address]   Angie's Doula Services  Angie Rosier     [phone]     [email or web address]   Lenoria Farrier: [place] [phone]       [email or web address]  [email or web address]   [place] [phone]   [email or web address] [place], [place]  Llana Aliment  [phone]  [email or web address]   [email or web address]   Ease Doula Collaborative   Burney Gauze   [phone]  [email or web address]   Rondel Baton Momence Doula  Rondel Baton  [phone] [email or web address]  Natural Baby Doulas  Jessica Bower           [place]     [phone] [email or web address]  [email or web address]   [facility]   Singac Foxx [phone] [email or web address]   Devoted Doula Services  Abbie Sons     [phone]  [email or web address]  [facility]     [phone]  [email or web address]  Facebook and IG @[email or web address] .Vivia Birmingham  [phone] [email or web address] .Dola Argyle [phone] [email or web address]   Delanna Ahmadi  [phone] [email or web address]     [facility]  [phone] [email or web address]   IG [email or web address]    Henrine Screws    [phone]  [email or web address]   Rachel Moulds "Humphreys" Free  [phone] [email or web address]    Mtende Roll  [phone] [email or web address]   Susie Williams   [email or web address]    Crissie Reese    [phone] [email or web address]     Carlean Jews  [phone] [email or web address]    Rigoberto Noel  [phone] [email or web address]    [place]sworth Rhem    [phone]   Baby on the Brain Karie Mainland  [phone] [email or web address]  Doula Mama Hollie Beach [phone] [email or web address]  Baby on the Brain Karie Mainland  [phone] [facility] [email or web address]  [facility] Owens Shark [phone]  [email or web address]  [email or web address] Nevins Harris-Jones  [phone] [email or web address] .[email or web address]   Renaee Munda [phone] [email or web address]   Einar Grad [phone] [email or web address]   Lance Muss  [phone] [email or web address]  Precious Mom[facility]  Deri Fuelling  [phone] [email or web address]   Maxwell Caul [phone] [email or web address]  MOOR Divine Myeka Dunn  [email or web address]   Threasa Alpha [phone] [email or web address]   Freddie Apley [phone] [email or web address]   Kennedy Bucker [phone] [email or web address]      AREA PEDIATRIC/FAMILY PRACTICE PHYSICIANS  Central/Southeast [place] [phone]) [facility] Erin Hearing, MD; Gwendlyn Deutscher, MD; Walker Kehr, MD; Andria Frames, MD; McDiarmid, MD; Dutch Quint, MD; Nori Riis, MD; Mingo Amber, MD [address], [place], [place] [address] [phone] Mon-Fri 8:30-12:30, 1:30-5:00 Providers come to see babies at [place] at [facility] providers who accept newborns: Dorthy Cooler, MD; Orland Mustard, MD; Stephanie Acre, MD [place], [place], [place] [address] [phone] Mon-Fri  8:00-5:30 Babies seen by providers at [facility] Does NOT accept Medicaid Please call early in hospitalization for appointment (limited availability)  [place], MD [address], [place],  [address] [phone] Madelyn Flavors, Fri  8:30-5:00, Wed 10:00-7:00 (closed 1-2pm) Babies seen by [facility] providers Accepting Medicaid Bee Ridge, MD [place], [place], [place] [address] [phone] Mon-Fri 8:30-5:00, Sat 8:30-12:00 Provider comes to see babies at [facility] Accepting Medicaid Must have been referred from current patients or contacted office prior to delivery [facility] ([place] for [place]) Owens Shark, MD; Tamera Punt, MD; Doneen Poisson, MD; Fatima Sanger, MD; Wynetta Emery, MD; Jess Barters, MD; Tami Ribas, MD; Herbert Moors, MD; Derrell Lolling, MD; Dorothyann Peng, MD; Lucious Groves, NP; Baldo Ash, NP [place], [place], [place] [address] [phone] Mon, [place] Poot, Thur, Fri 8:30-5:30, Wed 9:30-5:30, Sat 8:30-12:30 Babies seen by [facility] providers Accepting Medicaid Only accepting infants of first-time parents or siblings of current patients Hospital discharge coordinator will make follow-up appointment Baltazar Najjar [address], [place], [place]  [address] [phone]   Fax - [facility] [place]. [address], [place], [place]  [address] Phone - [phone]   Fax - Amo [address], [place], [place], [place]  [address] [phone]  East/Northeast [place] [phone]) [facility] Redmond Baseman, MD; Jacklynn Ganong, MD; Torrie Mayers, MD; MD; Rosana Hoes, MD; Servando Salina, MD; Rose Fillers, MD; Rex Kras, MD; Corinna Capra, MD; Volney American, MD; Trilby Drummer, MD; Janann Colonel, MD; Jimmye Norman, [place], [place], [place] [address] [phone] Mon-Fri 8:30-5:00 (extended evenings Mon-Thur as needed), Sat-Sun 10:00-1:00 Providers come to see babies at [facility] Accepting Medicaid for families of first-time babies and families with all children in the household age 33 and under. Must register with office prior to making appointment (M-F only). [place], NP; Tomi Bamberger, MD; Redmond School, MD; [place], [place] [address] [place], [place] [address] [phone] Mon-Fri 8:00-5:00 Babies seen by providers at [facility] Does NOT accept Medicaid/Commercial Insurance Only [facility] at [place] ([facility])  Sabino Gasser, MD; Drema Dallas, MD; Montine Circle, MD; Vilma Prader, MD; Vanita Panda, MD; Alfonso Ramus, MD; Ruthann Cancer, MD; Roxanne Mins, MD; Rosalva Ferron, MD; Polly Cobia, MD [place]., [place], [place] [address] [phone] Mon-Fri 8:30-5:30, Sat (Oct.-Mar.) 9:00-1:00 Babies seen by providers at [place] [phone]) [place], MD; Suzan Slick, MD [address], [place], [place] [address] [phone] Mon-Fri 8:30-5:00, Sat 8:30-12:00 Providers come to see babies at [facility] Does NOT accept Medicaid [place] at Arlina Robes, [place]; Mannie Stabile, MD; [place], [place]; Nancy Fetter, MD; Moreen Fowler, [place], [place], [place] [address] [phone] Mon-Fri 8:00-5:00 Babies seen by providers at [facility] Does NOT accept Medicaid Only accepting babies of parents who are patients Please call early in hospitalization for appointment (limited availability) [facility] Pediatricians Carlis Abbott, MD; Sharlene Motts, MD; Rod Can, MD; Warner Mccreedy, NP; Sabra Heck, MD; Ermalinda Memos, MD; Sharlett Iles, NP; Aurther Loft, MD; Jerrye Beavers, MD; Marcello Moores, MD; Berline Lopes, MD; Charolette Forward, MD [place]. [place], [place], [place] [address] [phone] Mon-Fri 8:00-5:00, Sat 9:00-12:00 Providers come to see babies at [facility] Does NOT accept [facility] [phone]) [place] at [place] providers accepting new patients: Dayna Ramus,  NP; Rolland Porter, [place], [place], [place] [address] [phone] Mon-Fri 8:00-5:00 Babies seen by providers at [facility] Does NOT accept Medicaid Only accepting babies of parents who are patients Please call early in hospitalization for appointment (limited availability) [facility] Abner Greenspan, MD; Sheran Lawless, MD [place]., [place], [place] [address] [phone] (press 1 to schedule appointment) Mon-Fri 8:00-5:00 Providers come to see babies at [facility] Does NOT accept Medicaid [facility]  Barbaraann Rondo, [place]., [place], [place] [address] [phone] Mon-Fri 8:30-5:00 (lunch 12:30-1:00), extended hours by appointment only Wed 5:00-6:30 Babies seen by [facility] providers Accepting Medicaid [place] at Nadeen Landau, MD; [place], [place]; Ethlyn Gallery, [place], [place], [place] [address] [phone] Mon-Fri 8:00-5:00 Babies seen by [facility] providers Does NOT accept Medicaid [place] at [place], MD; Yong Channel, MD; [place], [place]., [place], [place] [address] [phone] Mon-Fri 8:00-5:00 Babies seen by [facility] providers Does NOT accept [facility] [place], [place]; [place], [place]; [place], [place]; Albertina Parr, MD; Frederic Jericho, MD; Ronney Lion, MD; Carlos Levering, NP; Jerelene Redden, NP; Tomasita Crumble, NP; Ronelle Nigh, NP; Corinna Lines, MD; Karsten Ro, MD [address]., [place], [place] [address] [phone] Mon-Fri 8:30-5:00, Sat 10:00-1:00 Providers come to see babies at [facility] Does NOT accept Medicaid Free prenatal information session Tuesdays at 4:45pm [place], MD; [place], [place]; [place], [place]; [place], [place]., [place] [address] [phone] Mon-Fri 7:30-5:30 Babies seen by [facility] [address], [place], [place], [place]  [address] [phone]   Fax - [phone]  [place] [phone] & [phone]) [facility] [place] [address] [address] [place], [place] [address] [phone] Mon-Thur 8:00-6:00 Providers come to see babies at [place], NP; Melford Aase, MD; [place], [place]; [place], [place]., [place], [place] [address] [phone] Mon-Thur 7:30-7:30, Fri 7:30-4:30 Babies seen by [facility] providers Accepting [facility] Carolynn Sayers, MD; Cristino Martes, NP; Gertie Baron, MD [place], [place], [place] [address] [phone] Mon-Fri 8:30-5:00, Sat 8:30-12:00 Providers come to see babies at [facility] Accepting Medicaid Must have "Meet & Greet" appointment at office prior to delivery [place] ([place]) Faythe Dingwall, MD; Juleen China, MD; Clydene Laming, [place], [place], [place] [address] [phone] Mon-Wed 8:00-6:00, Thur-Fri 8:00-5:00, Sat 9:00-12:00 Providers come to see babies at [facility] Does NOT accept Medicaid Only accepting siblings of current patients [facility], [place], [place], [place]  [address] [phone]   Fax - [phone] [place] at [place]. [address], [place], [place]  [address] [phone]   Fax - [place] [phone] & [phone]) [place] at [facility] [place]; [place], Howell Barry., [place], [place] [address] [phone] Mon-Fri 7:00-5:00 Babies seen by [facility] providers Does NOT accept Medicaid [place], MD; [place], [place]; Between, [place] [address], [place], [place] [address] [phone] Mon-Fri 8:00-5:00 Babies seen by [facility] providers Accepting [facility] [place], MD; [place], [place]; [place], NP;  [place], [place] [address] [place], [place], [place] [address] [phone] Mon-Fri 8:00-5:00 Babies seen by providers at [facility] [place] [phone]) [facility] at [place], [place] [address] Madelaine Bhat [place], [place] [address] [phone] Mon-Fri 8:00-5:00 Babies seen by [facility] providers Does NOT accept Medicaid Limited availability, please call early in hospitalization to schedule follow-up [facility] Kennedy Bucker, [place]; Maisie Fus, MD; Charlesetta Garibaldi, MD; [place], [place]; Jeannine Kitten, MD; [place], [address], [place], [place] [address] [phone] Mon-Fri 8:30-5:00, Sat 9:00-12:00 Babies seen by providers at [facility] Accepting Medicaid Please register online then schedule online or call office [email or web address] [place] ([place] at [employer])  Yong Channel, NP; Dwyane Dee, MD; Leonidas Romberg, PA [address] [place], [place], [place] [address] [phone] Mon-Fri 8:00-5:00 Babies seen by providers at [place] ([facility]) Rondo, MD; Rayvon Char, NP; Melina Modena, MD [address] [place], [place], [place] [address] [phone] Mon-Fri 8:00-5:30, Sat&Sun by appointment (phones open at 8:30) Babies seen by [facility] providers Accepting Medicaid Must be a first-time baby or sibling of current patient [place]  [address], [place], [place]  [address] [phone]   Fax - [phone]  [place] [address] [phone] & [phone]) [place], [place]; [place], [place]; Sneads, MD; [place], [place]; Harrell Lark, MD [address] [place], [place] [address] [phone] Mon-Thur 8:00-7:00, Fri 8:00-5:00, Sat 8:00-12:00, Sun 9:00-12:00 Babies seen by [facility] providers Accepting Medicaid [facility] [facility] at Mikey College, MD; Ruthann Cancer, MD; [facility] [place], MD [address] [place], [place], [place] [address] [phone] Mon-Thur 8:00-5:00 Babies seen by providers at [facility] Accepting Medicaid [facility] [place] at Magdalene Molly, MD; Coe-Goins, MD; Amedeo Plenty, MD; Bobby Rumpf, MD; List, MD; Lavonia Drafts, MD; Ruthann Cancer, MD; Selinda Eon, MD; Audie Box, MD; Jim Like, MD; Christie Nottingham, MD; Hubbard Hartshorn, MD; Modena Nunnery, MD [address] Barbara Cower [place], [place] [address] [phone] Mon-Fri 8:00-5:30, Sat (Oct.-Mar.) 9:00-1:00 Babies seen by providers at [facility] Accepting Medicaid Must fill out new patient packet, available online at [email or web address] [place] ([facility] Fredderick Severance, NP; Kenton Kingfisher, NP; Claiborne Billings, NP; Rolla Plate, MD; [place], [place]; Carola Rhine, MD; [place], MD; Delia Chimes, NP [address], [place], [place] [address] [phone] Mon-Thur 8:00-5:30, Fri 8:00-5:00 Babies seen by providers at [place] [phone]) [place], [place]; [place], MD; [place], MD; [place], [place] [address] [place], [place] [address] [phone] Mon-Fri 8:00-5:00 Babies seen by providers at [place] [phone]) [place] at [facility], [place]; Olen Pel, MD; [place], [address], [place], [place] [address] [phone] Mon-Fri 8:00-5:00 Babies seen by providers at [facility] Does NOT accept Medicaid Limited appointment availability, please call early in hospitalization  [place] at [facility], [place]; [place], MD [address], [place], [place] [address] [phone] Mon-Fri 8:00-5:00 Babies seen by [facility] providers Does NOT accept Medicaid [place], MD; Guy Sandifer, MD; [place], [place]; [place], [place]. [address],  [place], [place] [address] [phone] Mon-Fri 8:00-5:00 After hours clinic [facility] [place], [place] [address]) [phone] Mon-Fri 5:00-8:00, Sat 12:00-6:00, Sun 10:00-4:00 Babies seen by [facility] providers Accepting Medicaid [place] at [facility] [address] [place], [place]  [address] [phone]   Fax - [phone]  [place] [phone]) [place] at [facility], [place] [address], [place], [place] [address] [phone] Mon-Fri 8:00-5:00 Babies seen by [facility] providers Does NOT accept Medicaid [place] ([place] at [place]) Daron Offer, [place] [address] [place], [place], [place] [address] [phone] Mon-Thur 8:00-7:00, Fri 8:00-5:00, Sat 8:00-12:00 Babies seen by providers at [facility] Accepting Medicaid - but does not have vaccinations in office (must be received elsewhere) Limited availability, please call early in hospitalization  [place] [phone]) [place], Melvin [place], [place] [address] [phone]  Fax [phone]  [place]  Lauretta Chester, MD, [place], [place], [place], [place], [place], [place] [address] [facility]  [place]dal[place], [place], [place] [address] [place], [place], [place] [address] [phone] ([place])  [facility] [address] [place], [place] [address] [place], [place], [place] [address] [phone] [facility] [address], [place], [place], [place] [address] [phone] [facility] [address] [place], [place] [address] [place], [place], [place]  [address] [facility] [address], [place], [place] [address] [phone] [place]. [address], [place], [place] [address] [phone] Dr[address] Okey Regal. [place] [address] [place], [place] [address] [facility] [address], [place] [address], [place],  [address] [facility] [address], [place],  [address] [phone]

## 2021-07-27 NOTE — Progress Notes (Signed)
   PRENATAL VISIT NOTE  Subjective:  Caitlin Gutierrez is a 35 y.o. G1P0 at [redacted]w[redacted]d being seen today for ongoing prenatal care.  She is currently monitored for the following issues for this low-risk pregnancy and has Supervision of other normal pregnancy, antepartum; Anemia affecting pregnancy in first trimester; Sickle cell trait ([place]); Uterine fibroids affecting pregnancy; and Alpha thalassemia silent carrier on their problem list.  Patient reports backache.  Contractions: Not present. Vag. Bleeding: None.  Movement: Present. Denies leaking of fluid.   The following portions of the patient's history were reviewed and updated as appropriate: allergies, current medications, past family history, past medical history, past social history, past surgical history and problem list.   Objective:   Vitals:   07/27/21 0856  BP: 123/75  Pulse: 87  Weight: 186 lb (84.4 kg)    Fetal Status: Fetal Heart Rate (bpm): 157 Fundal Height: 25 cm Movement: Present     General:  Alert, oriented and cooperative. Patient is in no acute distress.  Skin: Skin is warm and dry. No rash noted.   Cardiovascular: Normal heart rate noted  Respiratory: Normal respiratory effort, no problems with respiration noted  Abdomen: Soft, gravid, appropriate for gestational age.  Pain/Pressure: Absent     Pelvic: Cervical exam deferred        Extremities: Normal range of motion.  Edema: None  Mental Status: Normal mood and affect. Normal behavior. Normal judgment and thought content.   Assessment and Plan:  Pregnancy: G1P0 at [redacted]w[redacted]d 1. Supervision of other normal pregnancy, antepartum Patient is doing well  Third trimester labs today Encouraged back exercises and maternity support belt  2. Anemia affecting pregnancy in first trimester Continue iron supplement CBC today  3. Alpha thalassemia silent carrier S/p genetic counseling  Preterm labor symptoms and general obstetric precautions including but not limited to vaginal  bleeding, contractions, leaking of fluid and fetal movement were reviewed in detail with the patient. Please refer to After Visit Summary for other counseling recommendations.   Return in about 3 weeks (around 08/17/2021) for in person, ROB, Low risk.  Future Appointments  Date Time Provider [place]  07/27/2021  9:55 AM [place]sh Jablonsky, Vickii Chafe, MD [facility] None  09/15/2021  2:30 PM [facility] NURSE [facility]  09/15/2021  2:45 PM [facility] US5 [facility]    Mora Bellman, MD

## 2021-07-27 NOTE — Progress Notes (Signed)
ROB c/o back pain 6/10 x 2 days.

## 2021-07-28 LAB — CBC
Hematocrit: 30 % — ABNORMAL LOW (ref 34.0–46.6)
Hemoglobin: 10.1 g/dL — ABNORMAL LOW (ref 11.1–15.9)
MCH: 26.8 pg (ref 26.6–33.0)
MCHC: 33.7 g/dL (ref 31.5–35.7)
MCV: 80 fL (ref 79–97)
Platelets: 250 10*3/uL (ref 150–450)
RBC: 3.77 x10E6/uL (ref 3.77–5.28)
RDW: 12.7 % (ref 11.7–15.4)
WBC: 6.1 10*3/uL (ref 3.4–10.8)

## 2021-07-28 LAB — GLUCOSE TOLERANCE, 2 HOURS W/ 1HR
Glucose, 1 hour: 136 mg/dL (ref 70–179)
Glucose, 2 hour: 140 mg/dL (ref 70–152)
Glucose, Fasting: 74 mg/dL (ref 70–91)

## 2021-07-28 LAB — RPR: RPR Ser Ql: NONREACTIVE

## 2021-07-28 LAB — HIV ANTIBODY (ROUTINE TESTING W REFLEX): HIV Screen 4th Generation wRfx: NONREACTIVE

## 2021-08-04 ENCOUNTER — Ambulatory Visit: Payer: Self-pay

## 2021-08-18 ENCOUNTER — Ambulatory Visit (INDEPENDENT_AMBULATORY_CARE_PROVIDER_SITE_OTHER): Payer: Medicaid Other | Admitting: Family Medicine

## 2021-08-18 ENCOUNTER — Other Ambulatory Visit: Payer: Self-pay

## 2021-08-18 VITALS — BP 127/78 | HR 89 | Wt 186.0 lb

## 2021-08-18 DIAGNOSIS — D259 Leiomyoma of uterus, unspecified: Secondary | ICD-10-CM

## 2021-08-18 DIAGNOSIS — Z3480 Encounter for supervision of other normal pregnancy, unspecified trimester: Secondary | ICD-10-CM

## 2021-08-18 DIAGNOSIS — O3413 Maternal care for benign tumor of corpus uteri, third trimester: Secondary | ICD-10-CM

## 2021-08-18 NOTE — Patient Instructions (Signed)

## 2021-08-18 NOTE — Progress Notes (Signed)
° °  PRENATAL VISIT NOTE  Subjective:  Caitlin Gutierrez is a 35 y.o. G1P0 at [redacted]w[redacted]d being seen today for ongoing prenatal care.  She is currently monitored for the following issues for this low-risk pregnancy and has Supervision of other normal pregnancy, antepartum; Anemia affecting pregnancy in first trimester; Sickle cell trait ([place]); Uterine fibroids affecting pregnancy; and Alpha thalassemia silent carrier on their problem list.  Patient reports no complaints.  Contractions: Not present. Vag. Bleeding: None.  Movement: Present. Denies leaking of fluid.   The following portions of the patient's history were reviewed and updated as appropriate: allergies, current medications, past family history, past medical history, past social history, past surgical history and problem list.   Objective:   Vitals:   08/18/21 1610  BP: 127/78  Pulse: 89  Weight: 186 lb (84.4 kg)    Fetal Status: Fetal Heart Rate (bpm): 147 Fundal Height: 28 cm Movement: Present     General:  Alert, oriented and cooperative. Patient is in no acute distress.  Skin: Skin is warm and dry. No rash noted.   Cardiovascular: Normal heart rate noted  Respiratory: Normal respiratory effort, no problems with respiration noted  Abdomen: Soft, gravid, appropriate for gestational age.  Pain/Pressure: Absent     Pelvic: Cervical exam deferred        Extremities: Normal range of motion.  Edema: None  Mental Status: Normal mood and affect. Normal behavior. Normal judgment and thought content.   Assessment and Plan:  Pregnancy: G1P0 at [redacted]w[redacted]d 1. Supervision of other normal pregnancy, antepartum Continue routine prenatal care.   2. Leiomyoma of uterus affecting pregnancy in third trimester Has f/u u/s scheduled 09/15/2021  Preterm labor symptoms and general obstetric precautions including but not limited to vaginal bleeding, contractions, leaking of fluid and fetal movement were reviewed in detail with the patient. Please refer to  After Visit Summary for other counseling recommendations.   Return in 2 weeks (on 09/01/2021).  Future Appointments  Date Time Provider [place]  09/03/2021  3:30 PM Renee Harder, CNM [facility] None  09/15/2021  2:30 PM [facility] NURSE [facility]  09/15/2021  2:45 PM [facility] US5 [facility]    Donnamae Jude, MD

## 2021-09-03 ENCOUNTER — Other Ambulatory Visit: Payer: Self-pay

## 2021-09-03 ENCOUNTER — Ambulatory Visit (INDEPENDENT_AMBULATORY_CARE_PROVIDER_SITE_OTHER): Payer: Medicaid Other

## 2021-09-03 VITALS — BP 115/69 | HR 79 | Wt 188.4 lb

## 2021-09-03 DIAGNOSIS — O3413 Maternal care for benign tumor of corpus uteri, third trimester: Secondary | ICD-10-CM

## 2021-09-03 DIAGNOSIS — D573 Sickle-cell trait: Secondary | ICD-10-CM

## 2021-09-03 DIAGNOSIS — O99013 Anemia complicating pregnancy, third trimester: Secondary | ICD-10-CM

## 2021-09-03 DIAGNOSIS — Z3A30 30 weeks gestation of pregnancy: Secondary | ICD-10-CM

## 2021-09-03 DIAGNOSIS — Z3480 Encounter for supervision of other normal pregnancy, unspecified trimester: Secondary | ICD-10-CM

## 2021-09-03 DIAGNOSIS — D259 Leiomyoma of uterus, unspecified: Secondary | ICD-10-CM

## 2021-09-03 DIAGNOSIS — O99011 Anemia complicating pregnancy, first trimester: Secondary | ICD-10-CM

## 2021-09-03 NOTE — Progress Notes (Signed)
° °  PRENATAL VISIT NOTE  Subjective:  Caitlin Gutierrez is a 36 y.o. G1P0 at [redacted]w[redacted]d being seen today for ongoing prenatal care.  She is currently monitored for the following issues for this low-risk pregnancy and has Supervision of other normal pregnancy, antepartum; Anemia affecting pregnancy in first trimester; Sickle cell trait ([place]); Uterine fibroids affecting pregnancy; and Alpha thalassemia silent carrier on their problem list.  Patient reports no complaints.  Contractions: Not present. Vag. Bleeding: None.  Movement: Present. Denies leaking of fluid.   The following portions of the patient's history were reviewed and updated as appropriate: allergies, current medications, past family history, past medical history, past social history, past surgical history and problem list.   Objective:   Vitals:   09/03/21 1533  BP: 115/69  Pulse: 79  Weight: 188 lb 6.4 oz (85.5 kg)    Fetal Status: Fetal Heart Rate (bpm): 145 Fundal Height: 31 cm Movement: Present     General:  Alert, oriented and cooperative. Patient is in no acute distress.  Skin: Skin is warm and dry. No rash noted.   Cardiovascular: Normal heart rate noted  Respiratory: Normal respiratory effort, no problems with respiration noted  Abdomen: Soft, gravid, appropriate for gestational age.  Pain/Pressure: Absent     Pelvic: Cervical exam deferred        Extremities: Normal range of motion.  Edema: None  Mental Status: Normal mood and affect. Normal behavior. Normal judgment and thought content.   Assessment and Plan:  Pregnancy: G1P0 at [redacted]w[redacted]d 1. Supervision of other normal pregnancy, antepartum - Routine OB. Doing well, no concerns - Endorses active fetal movement - Anticipatory guidance for upcoming appointments provided  2. Anemia during pregnancy in third trimester - Continue iron supplementation  3. [redacted] weeks gestation of pregnancy   4. Leiomyoma of uterus affecting pregnancy in third trimester - [place] scheduled on  1/24   Preterm labor symptoms and general obstetric precautions including but not limited to vaginal bleeding, contractions, leaking of fluid and fetal movement were reviewed in detail with the patient. Please refer to After Visit Summary for other counseling recommendations.   Return in about 2 weeks (around 09/17/2021).  Future Appointments  Date Time Provider [place]  09/15/2021  2:30 PM [facility] NURSE [facility]  09/15/2021  2:45 PM [facility] US5 [facility]  09/16/2021  3:50 PM [place], Rona Ravens, NP [place] None    Renee Harder, CNM 09/03/21 4:20 PM

## 2021-09-03 NOTE — Progress Notes (Signed)
Patient presents for ROB. Patient complains of having lower back pain. Patient states that she has a support belt, but has not been wearing it. Patient advised to wear support belt to help her sx.

## 2021-09-15 ENCOUNTER — Encounter: Payer: Self-pay | Admitting: *Deleted

## 2021-09-15 ENCOUNTER — Other Ambulatory Visit: Payer: Self-pay

## 2021-09-15 ENCOUNTER — Ambulatory Visit: Payer: Medicaid Other | Attending: Obstetrics and Gynecology

## 2021-09-15 ENCOUNTER — Ambulatory Visit: Payer: Medicaid Other | Admitting: *Deleted

## 2021-09-15 VITALS — BP 123/66 | HR 89

## 2021-09-15 DIAGNOSIS — Z3480 Encounter for supervision of other normal pregnancy, unspecified trimester: Secondary | ICD-10-CM

## 2021-09-15 DIAGNOSIS — D563 Thalassemia minor: Secondary | ICD-10-CM | POA: Insufficient documentation

## 2021-09-15 DIAGNOSIS — O99011 Anemia complicating pregnancy, first trimester: Secondary | ICD-10-CM | POA: Insufficient documentation

## 2021-09-15 DIAGNOSIS — D573 Sickle-cell trait: Secondary | ICD-10-CM

## 2021-09-15 DIAGNOSIS — D259 Leiomyoma of uterus, unspecified: Secondary | ICD-10-CM | POA: Insufficient documentation

## 2021-09-15 DIAGNOSIS — O3412 Maternal care for benign tumor of corpus uteri, second trimester: Secondary | ICD-10-CM

## 2021-09-15 DIAGNOSIS — Z362 Encounter for other antenatal screening follow-up: Secondary | ICD-10-CM | POA: Diagnosis not present

## 2021-09-15 DIAGNOSIS — Z3A32 32 weeks gestation of pregnancy: Secondary | ICD-10-CM | POA: Insufficient documentation

## 2021-09-15 DIAGNOSIS — O3413 Maternal care for benign tumor of corpus uteri, third trimester: Secondary | ICD-10-CM | POA: Insufficient documentation

## 2021-09-16 ENCOUNTER — Ambulatory Visit (INDEPENDENT_AMBULATORY_CARE_PROVIDER_SITE_OTHER): Payer: Medicaid Other | Admitting: Nurse Practitioner

## 2021-09-16 ENCOUNTER — Encounter: Payer: Self-pay | Admitting: Nurse Practitioner

## 2021-09-16 ENCOUNTER — Other Ambulatory Visit: Payer: Self-pay | Admitting: *Deleted

## 2021-09-16 VITALS — BP 125/74 | HR 99 | Wt 190.8 lb

## 2021-09-16 DIAGNOSIS — O99013 Anemia complicating pregnancy, third trimester: Secondary | ICD-10-CM

## 2021-09-16 DIAGNOSIS — Z3689 Encounter for other specified antenatal screening: Secondary | ICD-10-CM

## 2021-09-16 DIAGNOSIS — O3413 Maternal care for benign tumor of corpus uteri, third trimester: Secondary | ICD-10-CM

## 2021-09-16 DIAGNOSIS — Z3A31 31 weeks gestation of pregnancy: Secondary | ICD-10-CM

## 2021-09-16 DIAGNOSIS — Z3403 Encounter for supervision of normal first pregnancy, third trimester: Secondary | ICD-10-CM

## 2021-09-16 MED ORDER — FERROUS SULFATE 325 (65 FE) MG PO TABS: 325.0000 mg | ORAL_TABLET | ORAL | 3 refills | Status: DC

## 2021-09-16 NOTE — Progress Notes (Signed)
° ° °  Subjective:  Caitlin Gutierrez is a 36 y.o. G1P0 at [redacted]w[redacted]d being seen today for ongoing prenatal care.  She is currently monitored for the following issues for this low-risk pregnancy and has Encounter for supervision of normal first pregnancy in third trimester; Anemia affecting pregnancy in first trimester; Sickle cell trait ([place]); Uterine fibroids affecting pregnancy; and Alpha thalassemia silent carrier on their problem list.  Patient reports no complaints.  Contractions: Not present. Vag. Bleeding: None.  Movement: Present. Denies leaking of fluid.   The following portions of the patient's history were reviewed and updated as appropriate: allergies, current medications, past family history, past medical history, past social history, past surgical history and problem list. Problem list updated.  Objective:   Vitals:   09/16/21 1550  BP: 125/74  Pulse: 99  Weight: 190 lb 12.8 oz (86.5 kg)    Fetal Status: Fetal Heart Rate (bpm): 147 Fundal Height: 33 cm Movement: Present     General:  Alert, oriented and cooperative. Patient is in no acute distress.  Skin: Skin is warm and dry. No rash noted.   Cardiovascular: Normal heart rate noted  Respiratory: Normal respiratory effort, no problems with respiration noted  Abdomen: Soft, gravid, appropriate for gestational age. Pain/Pressure: Absent     Pelvic:  Cervical exam deferred        Extremities: Normal range of motion.  Edema: None  Mental Status: Normal mood and affect. Normal behavior. Normal judgment and thought content.   Urinalysis:      Assessment and Plan:  Pregnancy: G1P0 at [redacted]w[redacted]d  1. Supervision of other normal pregnancy, antepartum Wants another pregnancy - advised to wait 18 months before becoming pregnant - may use condoms.  2. Anemia affecting pregnancy in third trimester Wanted iron refilled  - ferrous sulfate (FERROUSUL) 325 (65 FE) MG tablet; Take 1 tablet (325 mg total) by mouth every other day.  Dispense: 30  tablet; Refill: 3  3. [redacted] weeks gestation of pregnancy   Preterm labor symptoms and general obstetric precautions including but not limited to vaginal bleeding, contractions, leaking of fluid and fetal movement were reviewed in detail with the patient. Please refer to After Visit Summary for other counseling recommendations.  Return in about 2 weeks (around 09/30/2021) for in person ROB.  Earlie Server, RN, MSN, NP-BC Nurse Practitioner, [facility] for [employer] [employer] 09/16/2021 4:37 PM

## 2021-09-16 NOTE — Progress Notes (Signed)
Pt reports fetal movement, denies pain. Pt reports that she out of refills on iron

## 2021-09-16 NOTE — Patient Instructions (Signed)
[email or web address] for childbirth and breastfeeding classes

## 2021-09-30 ENCOUNTER — Other Ambulatory Visit: Payer: Self-pay

## 2021-09-30 ENCOUNTER — Ambulatory Visit (INDEPENDENT_AMBULATORY_CARE_PROVIDER_SITE_OTHER): Payer: Medicaid Other | Admitting: Nurse Practitioner

## 2021-09-30 VITALS — BP 123/73 | HR 93 | Wt 190.4 lb

## 2021-09-30 DIAGNOSIS — R110 Nausea: Secondary | ICD-10-CM

## 2021-09-30 DIAGNOSIS — O99013 Anemia complicating pregnancy, third trimester: Secondary | ICD-10-CM

## 2021-09-30 DIAGNOSIS — Z3403 Encounter for supervision of normal first pregnancy, third trimester: Secondary | ICD-10-CM

## 2021-09-30 DIAGNOSIS — Z3A34 34 weeks gestation of pregnancy: Secondary | ICD-10-CM

## 2021-09-30 MED ORDER — PROMETHAZINE HCL 12.5 MG PO TABS: 12.5000 mg | ORAL_TABLET | Freq: Four times a day (QID) | ORAL | 0 refills | Status: DC | PRN

## 2021-09-30 NOTE — Progress Notes (Signed)
° ° °  Subjective:  Caitlin Gutierrez is a 36 y.o. G1P0 at [redacted]w[redacted]d being seen today for ongoing prenatal care.  She is currently monitored for the following issues for this low-risk pregnancy and has Encounter for supervision of normal first pregnancy in third trimester; Anemia affecting pregnancy in first trimester; Sickle cell trait ([place]); Uterine fibroids affecting pregnancy; and Alpha thalassemia silent carrier on their problem list.  Patient reports  some nausea .  Contractions: Not present. Vag. Bleeding: None.  Movement: Present. Denies leaking of fluid.   The following portions of the patient's history were reviewed and updated as appropriate: allergies, current medications, past family history, past medical history, past social history, past surgical history and problem list. Problem list updated.  Objective:   Vitals:   09/30/21 1609  BP: 123/73  Pulse: 93  Weight: 190 lb 6.4 oz (86.4 kg)    Fetal Status: Fetal Heart Rate (bpm): 145 Fundal Height: 34 cm Movement: Present     General:  Alert, oriented and cooperative. Patient is in no acute distress.  Skin: Skin is warm and dry. No rash noted.   Cardiovascular: Normal heart rate noted  Respiratory: Normal respiratory effort, no problems with respiration noted  Abdomen: Soft, gravid, appropriate for gestational age. Pain/Pressure: Absent     Pelvic:  Cervical exam deferred        Extremities: Normal range of motion.  Edema: None  Mental Status: Normal mood and affect. Normal behavior. Normal judgment and thought content.   Urinalysis:      Assessment and Plan:  Pregnancy: G1P0 at [redacted]w[redacted]d  1. Encounter for supervision of normal first pregnancy in third trimester Baby moving well  2. Anemia affecting pregnancy in third trimester Taking iron  3. Nausea Will try medication to help her to be more comfortable  Did not want Zofran due to possible constipation.  Advised she may have drowsiness.  - promethazine (PHENERGAN) 12.5 MG  tablet; Take 1 tablet (12.5 mg total) by mouth every 6 (six) hours as needed for nausea or vomiting.  Dispense: 30 tablet; Refill: 0  4. [redacted] weeks gestation of pregnancy   Preterm labor symptoms and general obstetric precautions including but not limited to vaginal bleeding, contractions, leaking of fluid and fetal movement were reviewed in detail with the patient. Please refer to After Visit Summary for other counseling recommendations.  Return in about 2 weeks (around 10/14/2021) for in person ROB.  Earlie Server, RN, MSN, NP-BC Nurse Practitioner, [facility] for [employer], [employer] 09/30/2021 4:20 PM

## 2021-09-30 NOTE — Progress Notes (Signed)
Pt c/o nausea.  

## 2021-10-14 ENCOUNTER — Ambulatory Visit: Payer: Medicaid Other | Attending: Obstetrics and Gynecology | Admitting: *Deleted

## 2021-10-14 ENCOUNTER — Ambulatory Visit (HOSPITAL_BASED_OUTPATIENT_CLINIC_OR_DEPARTMENT_OTHER): Payer: Medicaid Other

## 2021-10-14 ENCOUNTER — Ambulatory Visit: Payer: Medicaid Other

## 2021-10-14 ENCOUNTER — Encounter: Payer: Self-pay | Admitting: *Deleted

## 2021-10-14 ENCOUNTER — Other Ambulatory Visit: Payer: Self-pay

## 2021-10-14 VITALS — BP 129/74 | HR 82

## 2021-10-14 DIAGNOSIS — Z3A36 36 weeks gestation of pregnancy: Secondary | ICD-10-CM | POA: Insufficient documentation

## 2021-10-14 DIAGNOSIS — Z3403 Encounter for supervision of normal first pregnancy, third trimester: Secondary | ICD-10-CM

## 2021-10-14 DIAGNOSIS — Z3689 Encounter for other specified antenatal screening: Secondary | ICD-10-CM | POA: Insufficient documentation

## 2021-10-14 DIAGNOSIS — D573 Sickle-cell trait: Secondary | ICD-10-CM | POA: Diagnosis not present

## 2021-10-14 DIAGNOSIS — O99013 Anemia complicating pregnancy, third trimester: Secondary | ICD-10-CM | POA: Insufficient documentation

## 2021-10-14 DIAGNOSIS — O3413 Maternal care for benign tumor of corpus uteri, third trimester: Secondary | ICD-10-CM | POA: Insufficient documentation

## 2021-10-14 DIAGNOSIS — O99011 Anemia complicating pregnancy, first trimester: Secondary | ICD-10-CM

## 2021-10-14 DIAGNOSIS — Z362 Encounter for other antenatal screening follow-up: Secondary | ICD-10-CM

## 2021-10-14 DIAGNOSIS — Z148 Genetic carrier of other disease: Secondary | ICD-10-CM | POA: Insufficient documentation

## 2021-10-14 DIAGNOSIS — D259 Leiomyoma of uterus, unspecified: Secondary | ICD-10-CM | POA: Insufficient documentation

## 2021-10-15 ENCOUNTER — Ambulatory Visit (INDEPENDENT_AMBULATORY_CARE_PROVIDER_SITE_OTHER): Payer: Medicaid Other | Admitting: Women's Health

## 2021-10-15 ENCOUNTER — Encounter: Payer: Self-pay | Admitting: Women's Health

## 2021-10-15 ENCOUNTER — Other Ambulatory Visit (HOSPITAL_COMMUNITY)
Admission: RE | Admit: 2021-10-15 | Discharge: 2021-10-15 | Disposition: A | Discharge status: Home / Self Care | Payer: Medicaid Other | Source: Ambulatory Visit | Attending: Women's Health | Admitting: Women's Health

## 2021-10-15 VITALS — BP 123/77 | HR 89 | Wt 191.0 lb

## 2021-10-15 DIAGNOSIS — Z3403 Encounter for supervision of normal first pregnancy, third trimester: Secondary | ICD-10-CM | POA: Diagnosis present

## 2021-10-15 DIAGNOSIS — O3413 Maternal care for benign tumor of corpus uteri, third trimester: Secondary | ICD-10-CM

## 2021-10-15 DIAGNOSIS — Z3A36 36 weeks gestation of pregnancy: Secondary | ICD-10-CM

## 2021-10-15 DIAGNOSIS — D259 Leiomyoma of uterus, unspecified: Secondary | ICD-10-CM

## 2021-10-15 DIAGNOSIS — D573 Sickle-cell trait: Secondary | ICD-10-CM

## 2021-10-15 DIAGNOSIS — O99011 Anemia complicating pregnancy, first trimester: Secondary | ICD-10-CM

## 2021-10-15 DIAGNOSIS — D563 Thalassemia minor: Secondary | ICD-10-CM

## 2021-10-15 NOTE — Patient Instructions (Signed)
Maternity Assessment Unit (MAU)  The Maternity Assessment Unit (MAU) is located at the [facility] and [place] at [facility]. The address is: [address], [place], [place], [place] [address]. Please see map below for additional directions.    The Maternity Assessment Unit is designed to help you during your pregnancy, and for up to 6 weeks after delivery, with any pregnancy- or postpartum-related emergencies, if you think you are in labor, or if your water has broken. For example, if you experience nausea and vomiting, vaginal bleeding, severe abdominal or pelvic pain, elevated blood pressure or other problems related to your pregnancy or postpartum time, please come to the Maternity Assessment Unit for assistance.       AREA PEDIATRIC/FAMILY PRACTICE PHYSICIANS  [facility] [place] [address] [place], [place] [address] Phone - [phone]   Fax - [place] [address] [place], [place]  [address] Phone - [phone]   Fax - [phone]  [place] [address] [place] [address] [place], [place]  [address] Phone - [phone]   Fax - [phone]  [facility] [address] [place], [place]  [address] Phone - [phone]   Fax - [phone]  [place] [address], [place], [place]  [address] Phone - [phone]   Fax - [place] [address] [place], [place]  [address] Phone - [phone]   Fax - [place] [facility] [place] [address] [place], [place]  [address] Phone - [phone]   Fax - [phone]  [place] [address] [place], [place], [place]  [address] Phone - [phone]   Fax - [place] [address] [place], [place]  [address] Phone - [phone]   Fax -  [phone] [facility] [place]. [address] [place], [place]  [address] Phone - [phone]   Fax - [phone]  [place] [address] [place], [place]  [address] Phone - [phone]   Fax - [phone]  [facility] [place], [place], [place]  [address] Phone - [phone]   Fax - [place] [address] [place], [place]  [address] Phone - [phone]   Fax - [phone]  [facility] [address] [place], [place]  [address] Phone - [place], [place]  [address] Phone - [phone]   Fax - [place] [address], [place], [place]  [address] Phone - [phone]   Fax - [phone]  [place] [address] [place], [place]  [address] Phone - [phone]   Fax - [place]. [place], [place]  [address] Phone - [phone]   Fax - [place], [place], [place]  [address] Phone - [phone]   Fax - [place] [address] [place], [place]  [address] Phone - [phone]   Fax - [phone]  [facility] [address] [place],   [address] Phone - [phone]   Fax - [place] [place],   [address] Phone - [phone]  Fax - [place] [address] [place], [place]  [address] Phone - [phone]   Fax - [phone] Arnaldo [place] [phone] [address] [place], [place]  [address] Phone - [phone]   Fax - [place] [address] [place], [place]  [address] Phone - [phone]   Fax - [place] [address] [place],   [address] Phone - [phone]   Fax - [phone]

## 2021-10-15 NOTE — Progress Notes (Addendum)
Subjective:  Caitlin Gutierrez is a 36 y.o. G1P0 at [redacted]w[redacted]d being seen today for ongoing prenatal care.  She is currently monitored for the following issues for this low-risk pregnancy and has Encounter for supervision of normal first pregnancy in third trimester; Anemia affecting pregnancy in first trimester; Sickle cell trait ([place]); Uterine fibroids affecting pregnancy; and Alpha thalassemia silent carrier on their problem list.  Patient reports no complaints.  Contractions: Not present. Vag. Bleeding: None.  Movement: Present. Denies leaking of fluid.   The following portions of the patient's history were reviewed and updated as appropriate: allergies, current medications, past family history, past medical history, past social history, past surgical history and problem list. Problem list updated.  Objective:   Vitals:   10/15/21 1438  BP: 123/77  Pulse: 89  Weight: 191 lb (86.6 kg)    Fetal Status: Fetal Heart Rate (bpm): 146 Fundal Height: 36 cm Movement: Present     General:  Alert, oriented and cooperative. Patient is in no acute distress.  Skin: Skin is warm and dry. No rash noted.   Cardiovascular: Normal heart rate noted  Respiratory: Normal respiratory effort, no problems with respiration noted  Abdomen: Soft, gravid, appropriate for gestational age. Pain/Pressure: Absent     Pelvic: Vag. Bleeding: None     Cervical exam performed Dilation: Closed Effacement (%): Thick Station: Ballotable  Extremities: Normal range of motion.  Edema: None  Mental Status: Normal mood and affect. Normal behavior. Normal judgment and thought content.   Urinalysis:      Assessment and Plan:  Pregnancy: G1P0 at [redacted]w[redacted]d  1. Encounter for supervision of normal first pregnancy in third trimester -peds list given  PHQ9 SCORE ONLY 07/27/2021 05/04/2021  PHQ-9 Total Score 0 1   GAD 7 : Generalized Anxiety Score 07/27/2021 05/04/2021  Nervous, Anxious, on Edge 0 0  Control/stop worrying 0 0  Worry too  much - different things 0 0  Trouble relaxing 2 0  Restless 0 0  Easily annoyed or irritable 0 0  Afraid - awful might happen 0 0  Total GAD 7 Score 2 0  Anxiety Difficulty Not difficult at all -    2. Anemia affecting pregnancy in first trimester -pt on oral iron CBC Latest Ref Rng & Units 07/27/2021 06/17/2021 05/04/2021  WBC 3.4 - 10.8 x10E3/uL 6.1 8.3 5.1  Hemoglobin 11.1 - 15.9 g/dL 10.1(L) 10.8(L) 9.8(L)  Hematocrit 34.0 - 46.6 % 30.0(L) 32.5(L) 28.8(L)  Platelets 150 - 450 x10E3/uL 250 264 307    3. Sickle cell trait [facility]) Reproductive partner, Foster Simpson, screened to not be a carrier for Sickle Cell Trait or other Beta-Hemoglobinopathies.  Urine culture today  4. Leiomyoma of uterus affecting pregnancy in third trimester -see [place] report for details  5. Alpha thalassemia silent carrier Reproductive partner, Foster Simpson, screened to also be a silent carrier for alpha thalassemia. There is zero risk for Leta and Richard's children together to be affected with Hemoglobin Bart's disease (--/--) or Hemoglobin H disease (--/a-). Reproductive outcomes could include the following:  25% chance for their children together to be non-carriers (aa/aa) 50% chance for their children together to be silent carriers (aa/a-) 25% chance for their children together to be carriers in the trans configuration (a-/a-)  6. [redacted] weeks gestation of pregnancy  Term labor symptoms and general obstetric precautions including but not limited to vaginal bleeding, contractions, leaking of fluid and fetal movement were reviewed in detail with the patient. I discussed the assessment and treatment  plan with the patient. The patient was provided an opportunity to ask questions and all were answered. The patient agreed with the plan and demonstrated an understanding of the instructions. The patient was advised to call back or seek an in-person office evaluation/go to MAU at [facility] for any  urgent or concerning symptoms. Please refer to After Visit Summary for other counseling recommendations.  Return in about 1 week (around 10/22/2021) for in-person LOB/APP OK.   Khamila Bassinger, Gerrie Nordmann, NP

## 2021-10-16 LAB — CERVICOVAGINAL ANCILLARY ONLY
Chlamydia: POSITIVE — AB
Comment: NEGATIVE
Comment: NORMAL
Neisseria Gonorrhea: NEGATIVE

## 2021-10-17 LAB — STREP GP B NAA: Strep Gp B NAA: NEGATIVE

## 2021-10-18 LAB — URINE CULTURE

## 2021-10-22 ENCOUNTER — Other Ambulatory Visit: Payer: Self-pay

## 2021-10-22 ENCOUNTER — Encounter: Payer: Self-pay | Admitting: Women's Health

## 2021-10-22 DIAGNOSIS — A749 Chlamydial infection, unspecified: Secondary | ICD-10-CM | POA: Insufficient documentation

## 2021-10-22 DIAGNOSIS — O98819 Other maternal infectious and parasitic diseases complicating pregnancy, unspecified trimester: Secondary | ICD-10-CM | POA: Insufficient documentation

## 2021-10-22 MED ORDER — AZITHROMYCIN 500 MG PO TABS: 1000.0000 mg | ORAL_TABLET | Freq: Once | ORAL | 0 refills | Status: AC

## 2021-10-22 NOTE — Progress Notes (Signed)
Please call patient to alert to positive CT results and send treatment per protocol. Patient will likely need translator to assist.  Thank you, Elmyra Ricks

## 2021-10-27 ENCOUNTER — Ambulatory Visit (INDEPENDENT_AMBULATORY_CARE_PROVIDER_SITE_OTHER): Payer: Medicaid Other | Admitting: Family Medicine

## 2021-10-27 ENCOUNTER — Encounter: Payer: Self-pay | Admitting: Family Medicine

## 2021-10-27 ENCOUNTER — Other Ambulatory Visit: Payer: Self-pay | Admitting: *Deleted

## 2021-10-27 ENCOUNTER — Encounter: Payer: Self-pay | Admitting: *Deleted

## 2021-10-27 ENCOUNTER — Other Ambulatory Visit: Payer: Self-pay

## 2021-10-27 VITALS — BP 127/81 | HR 96 | Wt 195.0 lb

## 2021-10-27 DIAGNOSIS — O98813 Other maternal infectious and parasitic diseases complicating pregnancy, third trimester: Secondary | ICD-10-CM

## 2021-10-27 DIAGNOSIS — Z3A38 38 weeks gestation of pregnancy: Secondary | ICD-10-CM

## 2021-10-27 DIAGNOSIS — A749 Chlamydial infection, unspecified: Secondary | ICD-10-CM

## 2021-10-27 DIAGNOSIS — O99011 Anemia complicating pregnancy, first trimester: Secondary | ICD-10-CM

## 2021-10-27 DIAGNOSIS — D573 Sickle-cell trait: Secondary | ICD-10-CM

## 2021-10-27 DIAGNOSIS — Z3403 Encounter for supervision of normal first pregnancy, third trimester: Secondary | ICD-10-CM

## 2021-10-27 MED ORDER — AZITHROMYCIN 500 MG PO TABS: 1000.0000 mg | ORAL_TABLET | Freq: Once | ORAL | 0 refills | Status: AC

## 2021-10-27 NOTE — Progress Notes (Signed)
? ? ?  Subjective:  ?Caitlin Gutierrez is a 36 y.o. G1P0 at 90w2dbeing seen today for ongoing prenatal care.  She is currently monitored for the following issues for this low-risk pregnancy and has Encounter for supervision of normal first pregnancy in third trimester; Anemia affecting pregnancy in first trimester; Sickle cell trait ([place]; Uterine fibroids affecting pregnancy; Alpha thalassemia silent carrier; and Chlamydia infection affecting pregnancy on their problem list. ? ?Patient reports no complaints.  Reports she took the azithromycin before, however since then has been sexually active with the same partner. Partner has not been treated. Not using condoms. Denies any abnormal discharge. Contractions: Not present. Vag. Bleeding: None.  Movement: Present. Denies leaking of fluid.  ? ?The following portions of the patient's history were reviewed and updated as appropriate: allergies, current medications, past family history, past medical history, past social history, past surgical history and problem list.  ? ?Objective:  ? ?Vitals:  ? 10/27/21 1423  ?BP: 127/81  ?Pulse: 96  ?Weight: 195 lb (88.5 kg)  ? ? ?Fetal Status: Fetal Heart Rate (bpm): 157 Fundal Height: 38 cm Movement: Present  Presentation: Vertex ? ?General:  Alert, oriented and cooperative. Patient is in no acute distress.  ?Skin: Skin is warm and dry. No rash noted.   ?Cardiovascular: Normal heart rate noted  ?Respiratory: Normal respiratory effort, no problems with respiration noted  ?Abdomen: Soft, gravid, appropriate for gestational age. Pain/Pressure: Absent     ?Pelvic:  Cervical exam performed in the presence of a chaperone Dilation: Closed Effacement (%): Thick Station: -3  ?Extremities: Normal range of motion.  Edema: None  ?Mental Status: Normal mood and affect. Normal behavior. Normal judgment and thought content.  ? ? ?Assessment and Plan:  ?Pregnancy: G1P0 at [redacted]w[redacted]d ?1. Encounter for supervision of normal first pregnancy in third  trimester ?Doing well with normal fetal movement. Needs IOL scheduled next appt if not delivered.  ? ?2. [redacted] weeks gestation of pregnancy ? ?3. Chlamydia infection affecting pregnancy in third trimester ?Positive test on 2/23 s/p azithromycin. However, as she has had unprotected intercourse since then with the same partner, recommended she take azithromycin again (already at pharmacy). She is now aware partner needs to be treated, planning to get this done tomorrow. Abstain from sexually activity for one week post treatment.  ? ?4. Sickle cell trait ([place]?S/p genetic counseling.  ? ?5. Anemia affecting pregnancy in first trimester ?Asymptomatic. Last Hgb 10.1. On ferrous sulfate.  ? ?Term labor symptoms and general obstetric precautions including but not limited to vaginal bleeding, contractions, leaking of fluid and fetal movement were reviewed in detail with the patient. ?Please refer to After Visit Summary for other counseling recommendations.  ? ?Return in about 1 week (around 11/03/2021) for LRAndrews? ? ?BePatriciaann ClanDO ?

## 2021-10-27 NOTE — Progress Notes (Signed)
Patient presents for ROB.  ?States she is aware of + Chlamydia and took the prescribed medication.  ?States that she had unprotected intercourse with untreated partner 3 or 4 days ago.  ?

## 2021-10-27 NOTE — Progress Notes (Signed)
Pt seen in the office 10/27/21. CT and RX addressed by provider.

## 2021-10-27 NOTE — Progress Notes (Signed)
3rd call to notify pt of CT and RX. No answer. HIPPA compliant VM left. In addition RX was sent for Azithromycin per protocol. Pt has seen results in [facility]. MyChart message sent regarding CT, RX, partner treatment and urgency of treatment due to pregnancy near term. Education on Chlamydia included. Pt language is not on file. All messages are in [place].

## 2021-11-04 ENCOUNTER — Ambulatory Visit (INDEPENDENT_AMBULATORY_CARE_PROVIDER_SITE_OTHER): Payer: Medicaid Other

## 2021-11-04 ENCOUNTER — Other Ambulatory Visit (HOSPITAL_COMMUNITY)
Admission: RE | Admit: 2021-11-04 | Discharge: 2021-11-04 | Disposition: A | Discharge status: Home / Self Care | Payer: Medicaid Other | Source: Ambulatory Visit

## 2021-11-04 ENCOUNTER — Other Ambulatory Visit: Payer: Self-pay

## 2021-11-04 VITALS — BP 129/82 | HR 101 | Wt 194.0 lb

## 2021-11-04 DIAGNOSIS — A749 Chlamydial infection, unspecified: Secondary | ICD-10-CM | POA: Diagnosis present

## 2021-11-04 DIAGNOSIS — O98813 Other maternal infectious and parasitic diseases complicating pregnancy, third trimester: Secondary | ICD-10-CM | POA: Diagnosis present

## 2021-11-04 DIAGNOSIS — Z3403 Encounter for supervision of normal first pregnancy, third trimester: Secondary | ICD-10-CM

## 2021-11-04 LAB — OB RESULTS CONSOLE GC/CHLAMYDIA: Gonorrhea: NEGATIVE

## 2021-11-04 NOTE — Progress Notes (Signed)
? ?  PRENATAL VISIT NOTE ? ?Subjective:  ?Caitlin Gutierrez is a 36 y.o. G1P0 at 26w3dbeing seen today for ongoing prenatal care.  She is currently monitored for the following issues for this low-risk pregnancy and has Encounter for supervision of normal first pregnancy in third trimester; Anemia affecting pregnancy in first trimester; Sickle cell trait ([place]; Uterine fibroids affecting pregnancy; Alpha thalassemia silent carrier; and Chlamydia infection affecting pregnancy on their problem list. ? ?Patient reports no complaints.  Contractions: Not present. Vag. Bleeding: None.  Movement: Present. Denies leaking of fluid.  ? ?The following portions of the patient's history were reviewed and updated as appropriate: allergies, current medications, past family history, past medical history, past social history, past surgical history and problem list.  ? ?Objective:  ? ?Vitals:  ? 11/04/21 1536  ?BP: 129/82  ?Pulse: (!) 101  ?Weight: 194 lb (88 kg)  ? ? ?Fetal Status: Fetal Heart Rate (bpm): 162 Fundal Height: 39 cm Movement: Present    ? ?General:  Alert, oriented and cooperative. Patient is in no acute distress.  ?Skin: Skin is warm and dry. No rash noted.   ?Cardiovascular: Normal heart rate noted  ?Respiratory: Normal respiratory effort, no problems with respiration noted  ?Abdomen: Soft, gravid, appropriate for gestational age.  Pain/Pressure: Absent     ?Pelvic: Cervical exam performed in the presence of a chaperone Dilation: Closed      ?Extremities: Normal range of motion.  Edema: None  ?Mental Status: Normal mood and affect. Normal behavior. Normal judgment and thought content.  ? ?Assessment and Plan:  ?Pregnancy: G1P0 at 364w3d1. Encounter for supervision of normal first pregnancy in third trimester ?- Routine OB. Doing well, no concerns ?- Endorses active fetal movement ?- She declines IOL or scheduling today. Discussed recommendations of IOL if no spontaneous labor before 41 weeks.  ? ?2. Chlamydia infection  affecting pregnancy in third trimester ?- TOC; partner treated and patient reports both have abstained ?- Cervicovaginal ancillary only( [place]) ? ? ?Term labor symptoms and general obstetric precautions including but not limited to vaginal bleeding, contractions, leaking of fluid and fetal movement were reviewed in detail with the patient. ?Please refer to After Visit Summary for other counseling recommendations.  ? ?Return in about 1 week (around 11/11/2021). ? ?No future appointments. ? ?DaRenee HarderCNM ? ?

## 2021-11-04 NOTE — Progress Notes (Signed)
Pt presents for last ROB visit! ?IOL scheduled 11/08/21 ?TOC CT due today - partner was treated; last unprotected IC 2 weeks ago. ?Requests cx check today.  ?

## 2021-11-05 LAB — CERVICOVAGINAL ANCILLARY ONLY
Chlamydia: NEGATIVE
Comment: NEGATIVE
Comment: NORMAL
Neisseria Gonorrhea: NEGATIVE

## 2021-11-08 ENCOUNTER — Other Ambulatory Visit: Payer: Self-pay

## 2021-11-08 ENCOUNTER — Inpatient Hospital Stay (HOSPITAL_COMMUNITY)
Admission: AD | Admit: 2021-11-08 | Discharge: 2021-11-12 | DRG: 786 | Disposition: A | Discharge status: Home / Self Care | Payer: PRIVATE HEALTH INSURANCE | Attending: Family Medicine | Admitting: Family Medicine

## 2021-11-08 ENCOUNTER — Encounter (HOSPITAL_COMMUNITY): Payer: Self-pay | Admitting: Family Medicine

## 2021-11-08 ENCOUNTER — Inpatient Hospital Stay (HOSPITAL_COMMUNITY): Admit: 2021-11-08 | Payer: Self-pay

## 2021-11-08 DIAGNOSIS — D62 Acute posthemorrhagic anemia: Secondary | ICD-10-CM | POA: Diagnosis not present

## 2021-11-08 DIAGNOSIS — D259 Leiomyoma of uterus, unspecified: Secondary | ICD-10-CM | POA: Diagnosis present

## 2021-11-08 DIAGNOSIS — O3413 Maternal care for benign tumor of corpus uteri, third trimester: Secondary | ICD-10-CM | POA: Diagnosis present

## 2021-11-08 DIAGNOSIS — D573 Sickle-cell trait: Secondary | ICD-10-CM | POA: Diagnosis present

## 2021-11-08 DIAGNOSIS — O134 Gestational [pregnancy-induced] hypertension without significant proteinuria, complicating childbirth: Secondary | ICD-10-CM | POA: Diagnosis present

## 2021-11-08 DIAGNOSIS — Z3A40 40 weeks gestation of pregnancy: Secondary | ICD-10-CM

## 2021-11-08 DIAGNOSIS — A749 Chlamydial infection, unspecified: Secondary | ICD-10-CM | POA: Diagnosis present

## 2021-11-08 DIAGNOSIS — O3410 Maternal care for benign tumor of corpus uteri, unspecified trimester: Secondary | ICD-10-CM | POA: Diagnosis present

## 2021-11-08 DIAGNOSIS — O368130 Decreased fetal movements, third trimester, not applicable or unspecified: Principal | ICD-10-CM | POA: Diagnosis present

## 2021-11-08 DIAGNOSIS — O411230 Chorioamnionitis, third trimester, not applicable or unspecified: Secondary | ICD-10-CM | POA: Diagnosis present

## 2021-11-08 DIAGNOSIS — O368131 Decreased fetal movements, third trimester, fetus 1: Secondary | ICD-10-CM | POA: Diagnosis present

## 2021-11-08 DIAGNOSIS — O9081 Anemia of the puerperium: Secondary | ICD-10-CM | POA: Diagnosis not present

## 2021-11-08 DIAGNOSIS — D563 Thalassemia minor: Secondary | ICD-10-CM | POA: Diagnosis present

## 2021-11-08 DIAGNOSIS — O139 Gestational [pregnancy-induced] hypertension without significant proteinuria, unspecified trimester: Secondary | ICD-10-CM | POA: Diagnosis present

## 2021-11-08 LAB — COMPREHENSIVE METABOLIC PANEL
ALT: 10 U/L (ref 0–44)
AST: 16 U/L (ref 15–41)
Albumin: 3.2 g/dL — ABNORMAL LOW (ref 3.5–5.0)
Alkaline Phosphatase: 209 U/L — ABNORMAL HIGH (ref 38–126)
Anion gap: 10 (ref 5–15)
BUN: 8 mg/dL (ref 6–20)
CO2: 21 mmol/L — ABNORMAL LOW (ref 22–32)
Calcium: 9.6 mg/dL (ref 8.9–10.3)
Chloride: 103 mmol/L (ref 98–111)
Creatinine, Ser: 0.81 mg/dL (ref 0.44–1.00)
GFR, Estimated: >60 mL/min (ref >60)
Glucose, Bld: 87 mg/dL (ref 70–99)
Potassium: 4.2 mmol/L (ref 3.5–5.1)
Sodium: 134 mmol/L — ABNORMAL LOW (ref 135–145)
Total Bilirubin: 0.4 mg/dL (ref 0.3–1.2)
Total Protein: 6.8 g/dL (ref 6.5–8.1)

## 2021-11-08 LAB — PROTEIN / CREATININE RATIO, URINE
Creatinine, Urine: 31.47 mg/dL
Total Protein, Urine: <6 mg/dL

## 2021-11-08 LAB — TYPE AND SCREEN
ABO/RH(D): O POS
Antibody Screen: NEGATIVE

## 2021-11-08 LAB — CBC
HCT: 32 % — ABNORMAL LOW (ref 36.0–46.0)
Hemoglobin: 11 g/dL — ABNORMAL LOW (ref 12.0–15.0)
MCH: 28.1 pg (ref 26.0–34.0)
MCHC: 34.4 g/dL (ref 30.0–36.0)
MCV: 81.6 fL (ref 80.0–100.0)
Platelets: 201 10*3/uL (ref 150–400)
RBC: 3.92 MIL/uL (ref 3.87–5.11)
RDW: 13.4 % (ref 11.5–15.5)
WBC: 6 10*3/uL (ref 4.0–10.5)
nRBC: 0 % (ref 0.0–0.2)

## 2021-11-08 MED ORDER — FENTANYL CITRATE (PF) 100 MCG/2ML IJ SOLN
100.0000 ug | INTRAMUSCULAR | Status: DC | PRN
  Administered 2021-11-09 (×4): 100 ug via INTRAVENOUS
  Filled 2021-11-08 (×5): qty 2

## 2021-11-08 MED ORDER — LACTATED RINGERS IV SOLN: INTRAVENOUS | Status: DC

## 2021-11-08 MED ORDER — TERBUTALINE SULFATE 1 MG/ML IJ SOLN
0.2500 mg | Freq: Once | INTRAMUSCULAR | Status: DC | PRN
Start: 2021-11-08 — End: 2021-11-10
  Filled 2021-11-08: qty 1

## 2021-11-08 MED ORDER — ONDANSETRON HCL 4 MG/2ML IJ SOLN
4.0000 mg | Freq: Four times a day (QID) | INTRAMUSCULAR | Status: DC | PRN
Start: 2021-11-08 — End: 2021-11-10
  Administered 2021-11-09: 4 mg via INTRAVENOUS
  Filled 2021-11-08: qty 2

## 2021-11-08 MED ORDER — FLEET ENEMA 7-19 GM/118ML RE ENEM: 1.0000 | ENEMA | RECTAL | Status: DC | PRN

## 2021-11-08 MED ORDER — OXYCODONE-ACETAMINOPHEN 5-325 MG PO TABS: 1.0000 | ORAL_TABLET | ORAL | Status: DC | PRN

## 2021-11-08 MED ORDER — ACETAMINOPHEN 325 MG PO TABS
650.0000 mg | ORAL_TABLET | ORAL | Status: DC | PRN
Start: 2021-11-08 — End: 2021-11-10

## 2021-11-08 MED ORDER — MISOPROSTOL 50MCG HALF TABLET
50.0000 ug | ORAL_TABLET | ORAL | Status: DC | PRN
Start: 2021-11-08 — End: 2021-11-10
  Administered 2021-11-08 (×2): 50 ug via BUCCAL
  Filled 2021-11-08 (×2): qty 1

## 2021-11-08 MED ORDER — OXYCODONE-ACETAMINOPHEN 5-325 MG PO TABS: 2.0000 | ORAL_TABLET | ORAL | Status: DC | PRN

## 2021-11-08 MED ORDER — OXYTOCIN-SODIUM CHLORIDE 30-0.9 UT/500ML-% IV SOLN
2.5000 [IU]/h | INTRAVENOUS | Status: DC
  Filled 2021-11-08: qty 500

## 2021-11-08 MED ORDER — LIDOCAINE HCL (PF) 1 % IJ SOLN: 30.0000 mL | INTRAMUSCULAR | Status: DC | PRN

## 2021-11-08 MED ORDER — LACTATED RINGERS IV SOLN: 500.0000 mL | INTRAVENOUS | Status: DC | PRN

## 2021-11-08 MED ORDER — SOD CITRATE-CITRIC ACID 500-334 MG/5ML PO SOLN
30.0000 mL | ORAL | Status: DC | PRN
Start: 2021-11-08 — End: 2021-11-10
  Administered 2021-11-10: 30 mL via ORAL
  Filled 2021-11-08: qty 30

## 2021-11-08 MED ORDER — OXYTOCIN BOLUS FROM INFUSION: 333.0000 mL | Freq: Once | INTRAVENOUS | Status: DC

## 2021-11-08 NOTE — Progress Notes (Signed)
Caitlin Gutierrez is a 36 y.o. G1P0 at 32w0dby LMP admitted for induction of labor due to decreased fetal movement and elevated BP. ? ?Subjective: ?Resting comfortably, no specific concerns at this time. Feeling small amount of pressure with contractions.  ? ?Objective: ?BP (!) 144/88   Pulse 75   Temp 98.7 ?F (37.1 ?C) (Oral)   Resp 18   Ht '5\' 4"'$  (1.626 m)   Wt 88.2 kg   LMP 02/01/2021   SpO2 99%   BMI 33.39 kg/m?  ?No intake/output data recorded. ?No intake/output data recorded. ? ?FHT:  FHR: 145 bpm, variability: moderate,  accelerations:  Present,  decelerations:  Absent ?UC:   regular, every 2-5 minutes ?SVE:   Dilation: Closed ?Effacement (%): Thick ?Station: Ballotable ?Exam by:: Dr. AGwenlyn Perking? ?Labs: ?Lab Results  ?Component Value Date  ? WBC 6.0 11/08/2021  ? HGB 11.0 (L) 11/08/2021  ? HCT 32.0 (L) 11/08/2021  ? MCV 81.6 11/08/2021  ? PLT 201 11/08/2021  ? ? ?Assessment / Plan: ?IOL for DFM and elevated BP. S/p Cytotec x 1 at 1924. Will plan for check at 2330 and potentially foley balloon at that time. PreE labs reviewed and WNL, PCR below calculable range. Blood pressures have been in the mild range in the last 3-4 hours. ? ?Labor:  Early Labor ?Fetal Wellbeing:  Category I ?Pain Control:   PRN, planning for epidural ?I/D:   GBS neg ?Anticipated MOD:  NSVD ? ?ADaniel Gutierrez?11/08/2021, 10:11 PM ? ? ?

## 2021-11-08 NOTE — MAU Provider Note (Signed)
Per RN: Ms. Lella Mullany is a 36 y.o. G1P0 at 37w0dwho presents to MAU stating she was told to come here today.  Reports decreased fetal movement today.  Denies PEC symptoms. ? ?O: BP (!) 159/91   Pulse 96   Temp 98.4 ?F (36.9 ?C) (Oral)   Resp 18   Ht '5\' 4"'$  (1.626 m)   Wt 89.4 kg   LMP 02/01/2021   SpO2 99%   BMI 33.83 kg/m?  ?Patient Vitals for the past 24 hrs: ? BP Temp Temp src Pulse Resp SpO2 Height Weight  ?11/08/21 1803 (!) 159/91 -- -- 96 -- 99 % -- --  ?11/08/21 1747 138/83 -- -- 91 -- -- -- --  ?11/08/21 1731 140/80 98.4 ?F (36.9 ?C) Oral 83 18 99 % -- --  ?11/08/21 1726 -- -- -- -- -- -- '5\' 4"'$  (1.626 m) 89.4 kg  ? ?Cervical exam: Deferred ? ?Fetal Monitoring: ?Baseline: 140 ?Variability: mod ?Accelerations: + ?Decelerations: no ?Contractions: irreg ?Vertex by BSUS ? ?A: ?SIUP at 435w0d?Decreased fetal movement ?Elevated blood pressure ? ?P: ?Admit to L&D ?Management per labor obtain, SmTamala JulianNM aware ? ?BhJulianne HandlerCNM ?11/08/2021 6:07 PM ? ?

## 2021-11-08 NOTE — H&P (Signed)
OBSTETRIC ADMISSION HISTORY AND PHYSICAL ? ?Laquisha Northcraft is a 36 y.o. female G1P0 with IUP at 40w0dby LMP presenting for IOL due to decreased fetal movement at term and elevated BP. She has had normal blood pressures this pregnancy. She reports that she started feeling less fetal movement this afternoon. She now feels baby moving more normally. She reports no LOF, no VB, no blurry vision, headaches, peripheral edema, or RUQ pain.  She plans on breast feeding. She is undecided about birth control postpartum.  ? ?She received her prenatal care at [facility].  ? ?Dating: By LMP --->  Estimated Date of Delivery: 11/08/21 ? ?Sono:   ?'@[redacted]w[redacted]d'$ , CWD, normal anatomy, cephalic presentation, anterior placental lie, 3061 g, 66% EFW ? ?Prenatal History/Complications:  ?Sickle cell trait (FOB negative) ?Alpha thalassemia silent carrier  ?Iron deficiency anemia in pregnancy  ?Uterine fibroids (multiple on [place] anterior) ?Chlamydia infection (TOC negative 3/15) ? ?Past Medical History: ?Past Medical History:  ?Diagnosis Date  ? Alpha thalassemia silent carrier 07/10/2021  ? ? ?Past Surgical History: ?Past Surgical History:  ?Procedure Laterality Date  ? NO PAST SURGERIES    ? ? ?Obstetrical History: ?OB History   ? ? Gravida  ?1  ? Para  ?   ? Term  ?   ? Preterm  ?   ? AB  ?   ? Living  ?   ?  ? ? SAB  ?   ? IAB  ?   ? Ectopic  ?   ? Multiple  ?   ? Live Births  ?   ?   ?  ?  ? ? ?Social History ?Social History  ? ?Socioeconomic History  ? Marital status: Married  ?  Spouse name: Not on file  ? Number of children: Not on file  ? Years of education: Not on file  ? Highest education level: Not on file  ?Occupational History  ? Not on file  ?Tobacco Use  ? Smoking status: Never  ? Smokeless tobacco: Never  ?Vaping Use  ? Vaping Use: Never used  ?Substance and Sexual Activity  ? Alcohol use: Not Currently  ? Drug use: Never  ? Sexual activity: Yes  ?  Partners: Male  ?  Birth control/protection: None  ?Other Topics Concern  ? Not on  file  ?Social History Narrative  ? Not on file  ? ?Social Determinants of Health  ? ?Financial Resource Strain: Not on file  ?Food Insecurity: Not on file  ?Transportation Needs: Not on file  ?Physical Activity: Not on file  ?Stress: Not on file  ?Social Connections: Not on file  ? ? ?Family History: ?Family History  ?Problem Relation Age of Onset  ? Hypertension Father   ? ? ?Allergies: ?No Known Allergies ? ?Medications Prior to Admission  ?Medication Sig Dispense Refill Last Dose  ? acetaminophen (TYLENOL) 500 MG tablet Take 2 tablets (1,000 mg total) by mouth every 6 (six) hours as needed for moderate pain. (Patient not taking: Reported on 10/15/2021) 30 tablet 0   ? ferrous sulfate (FERROUSUL) 325 (65 FE) MG tablet Take 1 tablet (325 mg total) by mouth every other day. 30 tablet 3   ? Prenat-Fe Carbonyl-FA-Omega 3 (ONE-A-DAY WOMENS PRENATAL 1) 28-0.8-235 MG CAPS Take by mouth.     ? promethazine (PHENERGAN) 12.5 MG tablet Take 1 tablet (12.5 mg total) by mouth every 6 (six) hours as needed for nausea or vomiting. (Patient not taking: Reported on 10/15/2021) 30 tablet 0   ? ? ? ?  Review of Systems  ?All systems reviewed and negative except as stated in HPI ? ?Blood pressure (!) 159/91, pulse 96, temperature 98.4 ?F (36.9 ?C), temperature source Oral, resp. rate 18, height '5\' 4"'$  (1.626 m), weight 89.4 kg, last menstrual period 02/01/2021, SpO2 99 %. ? ?General appearance: alert, cooperative, and no distress ?Lungs: normal work of breathing on room air  ?Heart: normal rate, warm and well perfused  ?Abdomen: soft, non-tender, gravid  ?Extremities: no LE edema or calf tenderness to palpation  ? ?Presentation: Cephalic by BSUS in MAU ?Fetal monitoring: Baseline 135 bpm, moderate variability, + accels, no decels  ?Uterine activity: Occasional contractions  ?Dilation: Closed ?Effacement (%): Thick ?Station: Ballotable ?Exam by:: Dr. Gwenlyn Perking ? ? ?Prenatal labs: ?ABO, Rh: O/Positive/-- (09/12 1007) ?Antibody: Negative (09/12  1007) ?Rubella: 2.91 (09/12 1007) ?RPR: Non Reactive (12/05 0912)  ?HBsAg: Negative (09/12 1007)  ?HIV: Non Reactive (12/05 0912)  ?GBS: Negative/-- (02/23 1504)  ?2 hr Glucola normal  ?Genetic screening - LR NIPS; SCT, alpha thalassemia silent carrier  ?Anatomy US normal  ? ?Prenatal Transfer Tool  ?Maternal Diabetes: No ?Genetic Screening: LR NIPS; SCT, alpha thalassemia silent carrier  ?Maternal Ultrasounds/Referrals: Normal ?Fetal Ultrasounds or other Referrals:  None ?Maternal Substance Abuse:  No ?Significant Maternal Medications:  None ?Significant Maternal Lab Results: Group B Strep negative ? ?No results found for this or any previous visit (from the past 24 hour(s)). ? ?Patient Active Problem List  ? Diagnosis Date Noted  ? Chlamydia infection affecting pregnancy 10/22/2021  ? Alpha thalassemia silent carrier 07/10/2021  ? Sickle cell trait ([place]) 06/17/2021  ? Uterine fibroids affecting pregnancy 06/17/2021  ? Anemia affecting pregnancy in first trimester 05/06/2021  ? Encounter for supervision of normal first pregnancy in third trimester 05/04/2021  ? ? ?Assessment/Plan:  ?Lynnlee Revels is a 36 y.o. G1P0 at 61w0dhere for IOL due to decreased fetal movement and elevated BP at term.  ? ?#Labor: Will start induction with buccal Cytotec. Plan to reassess in 4 hours. Will place foley balloon when able.  ?#Pain: PRN; planning for epidural  ?#FWB: Cat 1  ?#ID:  GBS neg ?#MOF: Breast  ?#MOC: Undecided ?#Circ:  Desires ? ?#Elevated BP: No prior elevations in BP this pregnancy. Denies symptoms of pre-eclampsia. Mild range BP currently. Will check CBC, CMP, UPC on admission. Will continue to monitor closely.  ? ?CGenia Del MD  ?11/08/2021, 6:18 PM ? ? ? ?

## 2021-11-08 NOTE — MAU Note (Signed)
Caitlin Gutierrez is a 36 y.o. at 46w0dhere in MAU reporting: was told to come here today but is unsure why. States not scheduled for IOL. No contractions, no bleeding, or LOF. DFM. ? ?Onset of complaint: today ? ?Pain score: 0/10 ? ?Vitals:  ? 11/08/21 1731  ?BP: 140/80  ?Pulse: 83  ?Resp: 18  ?Temp: 98.4 ?F (36.9 ?C)  ?SpO2: 99%  ?   ?FHT:144 ? ?Lab orders placed from triage: none ? ?

## 2021-11-09 ENCOUNTER — Encounter (HOSPITAL_COMMUNITY): Payer: Self-pay | Admitting: Family Medicine

## 2021-11-09 ENCOUNTER — Inpatient Hospital Stay (HOSPITAL_COMMUNITY): Payer: PRIVATE HEALTH INSURANCE | Admitting: Anesthesiology

## 2021-11-09 DIAGNOSIS — O134 Gestational [pregnancy-induced] hypertension without significant proteinuria, complicating childbirth: Secondary | ICD-10-CM

## 2021-11-09 DIAGNOSIS — Z3A40 40 weeks gestation of pregnancy: Secondary | ICD-10-CM

## 2021-11-09 LAB — RPR: RPR Ser Ql: NONREACTIVE

## 2021-11-09 LAB — CBC
HCT: 30.2 % — ABNORMAL LOW (ref 36.0–46.0)
Hemoglobin: 10.7 g/dL — ABNORMAL LOW (ref 12.0–15.0)
MCH: 28.4 pg (ref 26.0–34.0)
MCHC: 35.4 g/dL (ref 30.0–36.0)
MCV: 80.1 fL (ref 80.0–100.0)
Platelets: 189 10*3/uL (ref 150–400)
RBC: 3.77 MIL/uL — ABNORMAL LOW (ref 3.87–5.11)
RDW: 13.2 % (ref 11.5–15.5)
WBC: 9.2 10*3/uL (ref 4.0–10.5)
nRBC: 0 % (ref 0.0–0.2)

## 2021-11-09 MED ORDER — FENTANYL-BUPIVACAINE-NACL 0.5-0.125-0.9 MG/250ML-% EP SOLN
12.0000 mL/h | EPIDURAL | Status: DC | PRN
  Filled 2021-11-09 (×2): qty 250

## 2021-11-09 MED ORDER — DIPHENHYDRAMINE HCL 50 MG/ML IJ SOLN
12.5000 mg | INTRAMUSCULAR | Status: DC | PRN
  Administered 2021-11-10: 12.5 mg via INTRAVENOUS
  Filled 2021-11-09: qty 1

## 2021-11-09 MED ORDER — OXYTOCIN-SODIUM CHLORIDE 30-0.9 UT/500ML-% IV SOLN: 1.0000 m[IU]/min | INTRAVENOUS | Status: DC

## 2021-11-09 MED ORDER — PHENYLEPHRINE 40 MCG/ML (10ML) SYRINGE FOR IV PUSH (FOR BLOOD PRESSURE SUPPORT): 80.0000 ug | PREFILLED_SYRINGE | INTRAVENOUS | Status: DC | PRN

## 2021-11-09 MED ORDER — OXYTOCIN-SODIUM CHLORIDE 30-0.9 UT/500ML-% IV SOLN
1.0000 m[IU]/min | INTRAVENOUS | Status: DC
  Administered 2021-11-09: 6 m[IU]/min via INTRAVENOUS
  Administered 2021-11-09: 2 m[IU]/min via INTRAVENOUS
  Administered 2021-11-10: 6 m[IU]/min via INTRAVENOUS
  Administered 2021-11-10: 3 m[IU]/min via INTRAVENOUS

## 2021-11-09 MED ORDER — EPHEDRINE 5 MG/ML INJ: 10.0000 mg | INTRAVENOUS | Status: DC | PRN

## 2021-11-09 MED ORDER — FENTANYL-BUPIVACAINE-NACL 0.5-0.125-0.9 MG/250ML-% EP SOLN
EPIDURAL | Status: DC | PRN
  Administered 2021-11-09: 12 mL/h via EPIDURAL

## 2021-11-09 MED ORDER — TERBUTALINE SULFATE 1 MG/ML IJ SOLN: 0.2500 mg | Freq: Once | INTRAMUSCULAR | Status: DC | PRN

## 2021-11-09 MED ORDER — PHENYLEPHRINE 40 MCG/ML (10ML) SYRINGE FOR IV PUSH (FOR BLOOD PRESSURE SUPPORT)
80.0000 ug | PREFILLED_SYRINGE | INTRAVENOUS | Status: DC | PRN
  Filled 2021-11-09: qty 10

## 2021-11-09 MED ORDER — LIDOCAINE HCL (PF) 1 % IJ SOLN
INTRAMUSCULAR | Status: DC | PRN
  Administered 2021-11-09: 5 mL via EPIDURAL

## 2021-11-09 MED ORDER — LACTATED RINGERS IV SOLN: 500.0000 mL | Freq: Once | INTRAVENOUS | Status: DC

## 2021-11-09 NOTE — Anesthesia Preprocedure Evaluation (Addendum)
Anesthesia Evaluation  ?Patient identified by MRN, date of birth, ID band ?Patient awake ? ? ? ?Reviewed: ?Allergy & Precautions, NPO status , Patient's Chart, lab work & pertinent test results ? ?Airway ?Mallampati: II ? ?TM Distance: >3 FB ?Neck ROM: Full ? ? ? Dental ?no notable dental hx. ?(+) Teeth Intact, Dental Advisory Given ?  ?Pulmonary ?neg pulmonary ROS,  ?  ?Pulmonary exam normal ?breath sounds clear to auscultation ? ? ? ? ? ? Cardiovascular ?Normal cardiovascular exam ?Rhythm:Regular Rate:Normal ? ?gHTN ?  ?Neuro/Psych ?negative neurological ROS ?   ? GI/Hepatic ?negative GI ROS, Neg liver ROS,   ?Endo/Other  ? ? Renal/GU ?negative Renal ROS  ? ?  ?Musculoskeletal ?negative musculoskeletal ROS ?(+)  ? Abdominal ?  ?Peds ? Hematology ?Lab Results ?     Component                Value               Date                       ?     HGB                      10.7 (L)            11/09/2021           ?     HCT                      30.2 (L)            11/09/2021           ?     PLT                      189                 11/09/2021           ?   ?Anesthesia Other Findings ? ? Reproductive/Obstetrics ?(+) Pregnancy ? ?  ? ? ? ? ? ? ? ? ? ? ? ? ? ?  ?  ? ? ? ? ? ? ? ? ?Anesthesia Physical ?Anesthesia Plan ? ?ASA: 3 ? ?Anesthesia Plan: Epidural  ? ?Post-op Pain Management:   ? ?Induction:  ? ?PONV Risk Score and Plan:  ? ?Airway Management Planned:  ? ?Additional Equipment:  ? ?Intra-op Plan:  ? ?Post-operative Plan:  ? ?Informed Consent: I have reviewed the patients History and Physical, chart, labs and discussed the procedure including the risks, benefits and alternatives for the proposed anesthesia with the patient or authorized representative who has indicated his/her understanding and acceptance.  ? ? ? ? ? ?Plan Discussed with:  ? ?Anesthesia Plan Comments: (40.1 wk primagravida w gHtn for LEA ? ?UPDATE 11/10/21 12:05 PM: Patient to go to OR for C section due to failure to  progress. Labor epidural in place and functioning well. Has a hot spot on the left that resolves with PCA use. Will plan to use epidural for surgical anesthesia. Discussed with patient. )  ? ? ? ? ? ?Anesthesia Quick Evaluation ? ?

## 2021-11-09 NOTE — Progress Notes (Signed)
Caitlin Gutierrez is a 36 y.o. G1P0 at 2w1dby LMP admitted for induction of labor due to gHTN and DFM. ? ?Subjective: ?Sitting in High Fowler's position. Comfortable with epidural. Husband supportive at bedside. ? ?Objective: ?BP 138/80   Pulse 76   Temp (!) 97.5 ?F (36.4 ?C) (Axillary)   Resp 18   Ht '5\' 4"'$  (1.626 m)   Wt 88.2 kg   LMP 02/01/2021   SpO2 100%   BMI 33.39 kg/m?  ?No intake/output data recorded. ?No intake/output data recorded. ? ?FHT:  FHR: 130 bpm, variability: moderate,  accelerations:  Present,  decelerations:  Absent ?UC:   regular, every 2-3 minutes ?SVE:   Dilation: 4.5 ?Effacement (%): 80 ?Station: -3 ?Exam by:: SCecelia Byars?Pitocin 6 mU/min ? ?Labs: ?Lab Results  ?Component Value Date  ? WBC 9.2 11/09/2021  ? HGB 10.7 (L) 11/09/2021  ? HCT 30.2 (L) 11/09/2021  ? MCV 80.1 11/09/2021  ? PLT 189 11/09/2021  ? ? ?Assessment / Plan: ?Induction of labor due to gestational hypertension and DFM,  progressing well on pitocin ? ?Labor: Progressing on Pitocin, will continue to increase then AROM ?Preeclampsia:  labs stable ?Fetal Wellbeing:  Category I ?Pain Control:  Epidural ?I/D:   GBS Neg ?Anticipated MOD:  NSVD ? ?RLaury Deep CNM ?11/09/2021, 3:57 PM ? ? ?

## 2021-11-09 NOTE — Progress Notes (Signed)
Caitlin Gutierrez is a 36 y.o. G1P0 at 45w1dby LMP admitted for induction of labor due to gHTN and DFM. ? ?Subjective: ?Resting comfortably, no acute concerns at this time. SROM at 1626, clear fluid. Amenable to IUPC placement. ? ?Objective: ?BP 138/69   Pulse 78   Temp 98 ?F (36.7 ?C) (Oral)   Resp 18   Ht '5\' 4"'$  (1.626 m)   Wt 88.2 kg   LMP 02/01/2021   SpO2 100%   BMI 33.39 kg/m?  ?I/O last 3 completed shifts: ?In: -  ?Out: 1350 [[[id number]]?No intake/output data recorded. ? ?FHT:  FHR: 140 bpm, variability: moderate,  accelerations:  Present,  decelerations:  Absent ?UC:   regular, every 2-3 minutes ?SVE:   Dilation: 4.5 ?Effacement (%): 80, 90 ?Station: -2 ?Exam by:: GEulas PostResident ?Pitocin 6 mU/min ? ?Labs: ?Lab Results  ?Component Value Date  ? WBC 9.2 11/09/2021  ? HGB 10.7 (L) 11/09/2021  ? HCT 30.2 (L) 11/09/2021  ? MCV 80.1 11/09/2021  ? PLT 189 11/09/2021  ? ? ?Assessment / Plan: ?Induction of labor due to gestational hypertension and DFM,  progressing well on pitocin.  ? ?Labor: Progressing on Pitocin, SROM at 1626 with clear fluid. IUPC placed, contractions appear adequate.  ?Preeclampsia:  labs stable ?Fetal Wellbeing:  Category I ?Pain Control:  Epidural ?I/D:   GBS Neg ?Anticipated MOD:  NSVD ? ?ADaniel Nones MD ?FM PGY-1 ?11/09/2021, 9:38 PM ? ? ?

## 2021-11-09 NOTE — Progress Notes (Signed)
Ravan Schlemmer is a 36 y.o. G1P0 at 48w1dadmitted for induction of labor due to [place]. ? ?Subjective: ?Feels well. Likes sitting on birthing ball. Feeling contractions/cramping but able to cope with them ? ?Objective: ?BP (!) 146/84 (BP Location: Left Arm)   Pulse 84   Temp 98.7 ?F (37.1 ?C) (Axillary)   Resp 16   Ht '5\' 4"'$  (1.626 m)   Wt 88.2 kg   LMP 02/01/2021   SpO2 99%   BMI 33.39 kg/m?  ?No intake/output data recorded. ?No intake/output data recorded. ? ?FHT:  FHR: 140 bpm, variability: moderate,  accelerations:  Present,  decelerations:  Absent ?UC:   regular, every 1-3 minutes ?SVE:   Dilation: Fingertip ?Effacement (%): Thick ?Station: -3 ?Exam by:: Dr. DCy Blamer? ?Labs: ?Lab Results  ?Component Value Date  ? WBC 6.0 11/08/2021  ? HGB 11.0 (L) 11/08/2021  ? HCT 32.0 (L) 11/08/2021  ? MCV 81.6 11/08/2021  ? PLT 201 11/08/2021  ? ? ?Assessment / Plan: ? ? ?Labor:  Cervix now 1 cm after two cytotecs. Cooks balloon placed during current check with 80 cc in uterine balloon. Tolerated well by patient and fetus. Will hold off on additional cytotec as patient contracting every 1-2 min ? ?Fetal Wellbeing:  Category I ?Pain Control:  IV pain meds ?I/D:   GBS neg ? ? ?#gHTN ?BP mild range in 140s-150s. Asymptomatic. Will continue to monitor. ?ARenard Matter?11/09/2021, 2:15 AM ? ? ?

## 2021-11-09 NOTE — Anesthesia Procedure Notes (Signed)
Epidural ?Patient location during procedure: OB ?Start time: 11/09/2021 8:58 AM ?End time: 11/09/2021 9:13 AM ? ?Staffing ?Anesthesiologist: Barnet Glasgow, MD ?Performed: anesthesiologist  ? ?Preanesthetic Checklist ?Completed: patient identified, IV checked, site marked, risks and benefits discussed, surgical consent, monitors and equipment checked, pre-op evaluation and timeout performed ? ?Epidural ?Patient position: sitting ?Prep: DuraPrep and site prepped and draped ?Patient monitoring: continuous pulse ox and blood pressure ?Approach: midline ?Location: L3-L4 ?Injection technique: LOR air ? ?Needle:  ?Needle type: Tuohy  ?Needle gauge: 17 G ?Needle length: 9 cm and 9 ?Needle insertion depth: 6 cm ?Catheter type: closed end flexible ?Catheter size: 19 Gauge ?Catheter at skin depth: 12 cm ?Test dose: negative ? ?Assessment ?Events: blood not aspirated, injection not painful, no injection resistance, no paresthesia and negative IV test ? ?Additional Notes ?Patient identified. Risks/Benefits/Options discussed with patient including but not limited to bleeding, infection, nerve damage, paralysis, failed block, incomplete pain control, headache, blood pressure changes, nausea, vomiting, reactions to medication both or allergic, itching and postpartum back pain. Confirmed with bedside nurse the patient's most recent platelet count. Confirmed with patient that they are not currently taking any anticoagulation, have any bleeding history or any family history of bleeding disorders. Patient expressed understanding and wished to proceed. All questions were answered. Sterile technique was used throughout the entire procedure. Please see nursing notes for vital signs. Test dose was given through epidural needle and negative prior to continuing to dose epidural or start infusion. Warning signs of high block given to the patient including shortness of breath, tingling/numbness in hands, complete motor block, or any  concerning symptoms with instructions to call for help. Patient was given instructions on fall risk and not to get out of bed. All questions and concerns addressed with instructions to call with any issues.  1 Attempt (S) . Patient tolerated procedure well. ? ? ? ?

## 2021-11-09 NOTE — Progress Notes (Signed)
Caitlin Gutierrez is a 36 y.o. G1P0 at 20w1dby LMP admitted for induction of labor due to gHTN and DFM at term. ? ?Subjective: ?Resting well. Frequent position changes for prolonged decels in FHR. ? ?Objective: ?BP 138/80   Pulse 76   Temp (!) 97.5 ?F (36.4 ?C) (Axillary)   Resp 18   Ht '5\' 4"'$  (1.626 m)   Wt 88.2 kg   LMP 02/01/2021   SpO2 100%   BMI 33.39 kg/m?  ?No intake/output data recorded. ?No intake/output data recorded. ? ?FHT:  FHR: 135 bpm, variability: moderate,  accelerations:  Present,  decelerations:  Present prolonged decel ?UC:   regular, every 2-3.5 minutes ?SVE:   Dilation: 4.5 ?Effacement (%): 70 ?Station: -3 ?Exam by:: SCecelia Byars RN ?Pitocin @ 4 mU/min ? ?Labs: ?Lab Results  ?Component Value Date  ? WBC 9.2 11/09/2021  ? HGB 10.7 (L) 11/09/2021  ? HCT 30.2 (L) 11/09/2021  ? MCV 80.1 11/09/2021  ? PLT 189 11/09/2021  ? ? ?Assessment / Plan: ?Induction of labor due to gestational hypertension and DFM,  progressing well on pitocin ? ?Labor: Progressing on Pitocin, will continue to increase then AROM ?Preeclampsia:  labs stable ?Fetal Wellbeing:  Category I ?Pain Control:  Epidural ?I/D:   GBS Neg ?Anticipated MOD:  NSVD ? ?RLaury Deep CNM ?11/09/2021, 10:10 AM ? ? ?

## 2021-11-10 ENCOUNTER — Encounter (HOSPITAL_COMMUNITY)
Admission: AD | Disposition: A | Discharge status: Home / Self Care | Payer: Self-pay | Source: Home / Self Care | Attending: Family Medicine

## 2021-11-10 ENCOUNTER — Other Ambulatory Visit: Payer: Self-pay

## 2021-11-10 ENCOUNTER — Encounter (HOSPITAL_COMMUNITY): Payer: Self-pay | Admitting: Family Medicine

## 2021-11-10 DIAGNOSIS — Z3A40 40 weeks gestation of pregnancy: Secondary | ICD-10-CM

## 2021-11-10 DIAGNOSIS — D62 Acute posthemorrhagic anemia: Secondary | ICD-10-CM | POA: Diagnosis not present

## 2021-11-10 DIAGNOSIS — O368130 Decreased fetal movements, third trimester, not applicable or unspecified: Secondary | ICD-10-CM

## 2021-11-10 DIAGNOSIS — O139 Gestational [pregnancy-induced] hypertension without significant proteinuria, unspecified trimester: Secondary | ICD-10-CM | POA: Diagnosis present

## 2021-11-10 DIAGNOSIS — O134 Gestational [pregnancy-induced] hypertension without significant proteinuria, complicating childbirth: Secondary | ICD-10-CM

## 2021-11-10 DIAGNOSIS — O411230 Chorioamnionitis, third trimester, not applicable or unspecified: Secondary | ICD-10-CM

## 2021-11-10 HISTORY — DX: Gestational (pregnancy-induced) hypertension without significant proteinuria, unspecified trimester: O13.9

## 2021-11-10 LAB — CBC WITH DIFFERENTIAL/PLATELET
Abs Immature Granulocytes: 0.17 10*3/uL — ABNORMAL HIGH (ref 0.00–0.07)
Basophils Absolute: 0 10*3/uL (ref 0.0–0.1)
Basophils Relative: 0 %
Eosinophils Absolute: 0 10*3/uL (ref 0.0–0.5)
Eosinophils Relative: 0 %
HCT: 25.7 % — ABNORMAL LOW (ref 36.0–46.0)
Hemoglobin: 9.2 g/dL — ABNORMAL LOW (ref 12.0–15.0)
Immature Granulocytes: 1 %
Lymphocytes Relative: 4 %
Lymphs Abs: 0.7 10*3/uL (ref 0.7–4.0)
MCH: 28.8 pg (ref 26.0–34.0)
MCHC: 35.8 g/dL (ref 30.0–36.0)
MCV: 80.3 fL (ref 80.0–100.0)
Monocytes Absolute: 1.3 10*3/uL — ABNORMAL HIGH (ref 0.1–1.0)
Monocytes Relative: 7 %
Neutro Abs: 15.8 10*3/uL — ABNORMAL HIGH (ref 1.7–7.7)
Neutrophils Relative %: 88 %
Platelets: 173 10*3/uL (ref 150–400)
RBC: 3.2 MIL/uL — ABNORMAL LOW (ref 3.87–5.11)
RDW: 13.2 % (ref 11.5–15.5)
WBC: 17.9 10*3/uL — ABNORMAL HIGH (ref 4.0–10.5)
nRBC: 0 % (ref 0.0–0.2)

## 2021-11-10 SURGERY — Surgical Case
Anesthesia: Epidural

## 2021-11-10 MED ORDER — ONDANSETRON HCL 4 MG/2ML IJ SOLN: 4.0000 mg | Freq: Three times a day (TID) | INTRAMUSCULAR | Status: DC | PRN

## 2021-11-10 MED ORDER — NIFEDIPINE ER OSMOTIC RELEASE 30 MG PO TB24
30.0000 mg | ORAL_TABLET | Freq: Every day | ORAL | Status: DC
  Administered 2021-11-10 – 2021-11-12 (×3): 30 mg via ORAL
  Filled 2021-11-10 (×3): qty 1

## 2021-11-10 MED ORDER — MAGNESIUM HYDROXIDE 400 MG/5ML PO SUSP: 30.0000 mL | ORAL | Status: DC | PRN

## 2021-11-10 MED ORDER — ACETAMINOPHEN 10 MG/ML IV SOLN
INTRAVENOUS | Status: AC
  Filled 2021-11-10: qty 100

## 2021-11-10 MED ORDER — OXYTOCIN-SODIUM CHLORIDE 30-0.9 UT/500ML-% IV SOLN
INTRAVENOUS | Status: AC
  Filled 2021-11-10: qty 1000

## 2021-11-10 MED ORDER — ONDANSETRON HCL 4 MG/2ML IJ SOLN
INTRAMUSCULAR | Status: AC
  Filled 2021-11-10: qty 4

## 2021-11-10 MED ORDER — NALOXONE HCL 0.4 MG/ML IJ SOLN: 0.4000 mg | INTRAMUSCULAR | Status: DC | PRN

## 2021-11-10 MED ORDER — SODIUM CHLORIDE 0.9 % IV SOLN
INTRAVENOUS | Status: AC
  Filled 2021-11-10: qty 5

## 2021-11-10 MED ORDER — DIBUCAINE (PERIANAL) 1 % EX OINT: 1.0000 "application " | TOPICAL_OINTMENT | CUTANEOUS | Status: DC | PRN

## 2021-11-10 MED ORDER — MORPHINE SULFATE (PF) 0.5 MG/ML IJ SOLN
INTRAMUSCULAR | Status: AC
  Filled 2021-11-10: qty 10

## 2021-11-10 MED ORDER — TRANEXAMIC ACID-NACL 1000-0.7 MG/100ML-% IV SOLN
INTRAVENOUS | Status: AC
  Filled 2021-11-10: qty 100

## 2021-11-10 MED ORDER — FENTANYL CITRATE (PF) 100 MCG/2ML IJ SOLN
INTRAMUSCULAR | Status: AC
  Filled 2021-11-10: qty 2

## 2021-11-10 MED ORDER — OXYTOCIN-SODIUM CHLORIDE 30-0.9 UT/500ML-% IV SOLN
INTRAVENOUS | Status: DC | PRN
Start: 2021-11-10 — End: 2021-11-10
  Administered 2021-11-10: 300 mL via INTRAVENOUS

## 2021-11-10 MED ORDER — TETANUS-DIPHTH-ACELL PERTUSSIS 5-2.5-18.5 LF-MCG/0.5 IM SUSY
0.5000 mL | PREFILLED_SYRINGE | Freq: Once | INTRAMUSCULAR | Status: DC
Start: 2021-11-11 — End: 2021-11-10

## 2021-11-10 MED ORDER — SENNOSIDES-DOCUSATE SODIUM 8.6-50 MG PO TABS
2.0000 | ORAL_TABLET | Freq: Every day | ORAL | Status: DC
  Administered 2021-11-11 – 2021-11-12 (×2): 2 via ORAL
  Filled 2021-11-10 (×2): qty 2

## 2021-11-10 MED ORDER — CEFAZOLIN SODIUM-DEXTROSE 2-4 GM/100ML-% IV SOLN
2.0000 g | INTRAVENOUS | Status: AC
  Administered 2021-11-10: 2 g via INTRAVENOUS
  Filled 2021-11-10: qty 100

## 2021-11-10 MED ORDER — SODIUM CHLORIDE 0.9% FLUSH: 3.0000 mL | INTRAVENOUS | Status: DC | PRN

## 2021-11-10 MED ORDER — ACETAMINOPHEN 10 MG/ML IV SOLN: 1000.0000 mg | Freq: Once | INTRAVENOUS | Status: DC | PRN

## 2021-11-10 MED ORDER — PRENATAL MULTIVITAMIN CH
1.0000 | ORAL_TABLET | Freq: Every day | ORAL | Status: DC
  Administered 2021-11-11 – 2021-11-12 (×2): 1 via ORAL
  Filled 2021-11-10 (×2): qty 1

## 2021-11-10 MED ORDER — WITCH HAZEL-GLYCERIN EX PADS: 1.0000 "application " | MEDICATED_PAD | CUTANEOUS | Status: DC | PRN

## 2021-11-10 MED ORDER — NALOXONE HCL 4 MG/10ML IJ SOLN
1.0000 ug/kg/h | INTRAVENOUS | Status: DC | PRN
  Filled 2021-11-10: qty 5

## 2021-11-10 MED ORDER — OXYTOCIN-SODIUM CHLORIDE 30-0.9 UT/500ML-% IV SOLN: 2.5000 [IU]/h | INTRAVENOUS | Status: DC

## 2021-11-10 MED ORDER — ENOXAPARIN SODIUM 40 MG/0.4ML IJ SOSY
40.0000 mg | PREFILLED_SYRINGE | INTRAMUSCULAR | Status: DC
  Administered 2021-11-11 – 2021-11-12 (×2): 40 mg via SUBCUTANEOUS
  Filled 2021-11-10 (×2): qty 0.4

## 2021-11-10 MED ORDER — DEXAMETHASONE SODIUM PHOSPHATE 4 MG/ML IJ SOLN
INTRAMUSCULAR | Status: DC | PRN
  Administered 2021-11-10: 4 mg via INTRAVENOUS

## 2021-11-10 MED ORDER — SIMETHICONE 80 MG PO CHEW
80.0000 mg | CHEWABLE_TABLET | Freq: Three times a day (TID) | ORAL | Status: DC
  Administered 2021-11-10 – 2021-11-12 (×5): 80 mg via ORAL
  Filled 2021-11-10 (×5): qty 1

## 2021-11-10 MED ORDER — MENTHOL 3 MG MT LOZG: 1.0000 | LOZENGE | OROMUCOSAL | Status: DC | PRN

## 2021-11-10 MED ORDER — SOD CITRATE-CITRIC ACID 500-334 MG/5ML PO SOLN: 30.0000 mL | ORAL | Status: DC

## 2021-11-10 MED ORDER — ACETAMINOPHEN 500 MG PO TABS
1000.0000 mg | ORAL_TABLET | Freq: Four times a day (QID) | ORAL | Status: DC
  Administered 2021-11-10 – 2021-11-12 (×6): 1000 mg via ORAL
  Filled 2021-11-10 (×6): qty 2

## 2021-11-10 MED ORDER — SODIUM CHLORIDE 0.9 % IV SOLN
500.0000 mg | INTRAVENOUS | Status: AC
  Administered 2021-11-10: 500 mg via INTRAVENOUS
  Filled 2021-11-10: qty 5

## 2021-11-10 MED ORDER — OXYCODONE HCL 5 MG PO TABS: 5.0000 mg | ORAL_TABLET | Freq: Once | ORAL | Status: DC | PRN

## 2021-11-10 MED ORDER — COCONUT OIL OIL: 1.0000 "application " | TOPICAL_OIL | Status: DC | PRN

## 2021-11-10 MED ORDER — DIPHENHYDRAMINE HCL 50 MG/ML IJ SOLN: 12.5000 mg | INTRAMUSCULAR | Status: DC | PRN

## 2021-11-10 MED ORDER — SIMETHICONE 80 MG PO CHEW
80.0000 mg | CHEWABLE_TABLET | ORAL | Status: DC | PRN
  Administered 2021-11-10: 80 mg via ORAL
  Filled 2021-11-10: qty 1

## 2021-11-10 MED ORDER — SODIUM CHLORIDE 0.9 % IV SOLN
2.0000 g | Freq: Two times a day (BID) | INTRAVENOUS | Status: AC
  Administered 2021-11-10 – 2021-11-11 (×2): 2 g via INTRAVENOUS
  Filled 2021-11-10 (×3): qty 2

## 2021-11-10 MED ORDER — HYDROMORPHONE HCL 1 MG/ML IJ SOLN
INTRAMUSCULAR | Status: AC
  Filled 2021-11-10: qty 0.5

## 2021-11-10 MED ORDER — LIDOCAINE-EPINEPHRINE (PF) 2 %-1:200000 IJ SOLN
INTRAMUSCULAR | Status: AC
  Filled 2021-11-10: qty 20

## 2021-11-10 MED ORDER — LACTATED RINGERS IV SOLN: INTRAVENOUS | Status: DC

## 2021-11-10 MED ORDER — PROCHLORPERAZINE EDISYLATE 10 MG/2ML IJ SOLN
10.0000 mg | Freq: Once | INTRAMUSCULAR | Status: DC | PRN
  Filled 2021-11-10: qty 2

## 2021-11-10 MED ORDER — HYDROMORPHONE HCL 1 MG/ML IJ SOLN
0.2500 mg | INTRAMUSCULAR | Status: DC | PRN
  Administered 2021-11-10: 0.5 mg via INTRAVENOUS

## 2021-11-10 MED ORDER — KETOROLAC TROMETHAMINE 30 MG/ML IJ SOLN: 30.0000 mg | Freq: Four times a day (QID) | INTRAMUSCULAR | Status: DC | PRN

## 2021-11-10 MED ORDER — DIPHENHYDRAMINE HCL 25 MG PO CAPS: 25.0000 mg | ORAL_CAPSULE | Freq: Four times a day (QID) | ORAL | Status: DC | PRN

## 2021-11-10 MED ORDER — LIDOCAINE-EPINEPHRINE (PF) 2 %-1:200000 IJ SOLN
INTRAMUSCULAR | Status: DC | PRN
  Administered 2021-11-10: 10 mL via EPIDURAL
  Administered 2021-11-10: 5 mL via EPIDURAL

## 2021-11-10 MED ORDER — OXYCODONE HCL 5 MG PO TABS: 5.0000 mg | ORAL_TABLET | ORAL | Status: DC | PRN

## 2021-11-10 MED ORDER — ACETAMINOPHEN 10 MG/ML IV SOLN
INTRAVENOUS | Status: DC | PRN
  Administered 2021-11-10: 1000 mg via INTRAVENOUS

## 2021-11-10 MED ORDER — DEXAMETHASONE SODIUM PHOSPHATE 4 MG/ML IJ SOLN
INTRAMUSCULAR | Status: AC
  Filled 2021-11-10: qty 1

## 2021-11-10 MED ORDER — MORPHINE SULFATE (PF) 0.5 MG/ML IJ SOLN
INTRAMUSCULAR | Status: DC | PRN
  Administered 2021-11-10: 3 mg via EPIDURAL

## 2021-11-10 MED ORDER — ONDANSETRON HCL 4 MG/2ML IJ SOLN
INTRAMUSCULAR | Status: DC | PRN
  Administered 2021-11-10: 4 mg via INTRAVENOUS

## 2021-11-10 MED ORDER — DIPHENHYDRAMINE HCL 25 MG PO CAPS: 25.0000 mg | ORAL_CAPSULE | ORAL | Status: DC | PRN

## 2021-11-10 MED ORDER — MEDROXYPROGESTERONE ACETATE 150 MG/ML IM SUSP
150.0000 mg | INTRAMUSCULAR | Status: DC | PRN
Start: 2021-11-10 — End: 2021-11-12

## 2021-11-10 MED ORDER — IBUPROFEN 600 MG PO TABS
600.0000 mg | ORAL_TABLET | Freq: Four times a day (QID) | ORAL | Status: DC
  Administered 2021-11-11 – 2021-11-12 (×4): 600 mg via ORAL
  Filled 2021-11-10 (×4): qty 1

## 2021-11-10 MED ORDER — MEASLES, MUMPS & RUBELLA VAC IJ SOLR: 0.5000 mL | Freq: Once | INTRAMUSCULAR | Status: DC

## 2021-11-10 MED ORDER — SCOPOLAMINE 1 MG/3DAYS TD PT72: 1.0000 | MEDICATED_PATCH | Freq: Once | TRANSDERMAL | Status: DC

## 2021-11-10 MED ORDER — MEPERIDINE HCL 25 MG/ML IJ SOLN: 6.2500 mg | INTRAMUSCULAR | Status: DC | PRN

## 2021-11-10 MED ORDER — TRANEXAMIC ACID-NACL 1000-0.7 MG/100ML-% IV SOLN
INTRAVENOUS | Status: DC | PRN
  Administered 2021-11-10: 1000 mg via INTRAVENOUS

## 2021-11-10 MED ORDER — OXYCODONE HCL 5 MG/5ML PO SOLN: 5.0000 mg | Freq: Once | ORAL | Status: DC | PRN

## 2021-11-10 MED ORDER — FENTANYL CITRATE (PF) 100 MCG/2ML IJ SOLN
INTRAMUSCULAR | Status: DC | PRN
  Administered 2021-11-10 (×2): 100 ug via EPIDURAL

## 2021-11-10 MED ORDER — KETOROLAC TROMETHAMINE 30 MG/ML IJ SOLN
30.0000 mg | Freq: Four times a day (QID) | INTRAMUSCULAR | Status: AC
  Administered 2021-11-10 – 2021-11-11 (×4): 30 mg via INTRAVENOUS
  Filled 2021-11-10 (×4): qty 1

## 2021-11-10 MED ORDER — NALBUPHINE HCL 10 MG/ML IJ SOLN
5.0000 mg | INTRAMUSCULAR | Status: DC | PRN
  Administered 2021-11-10 – 2021-11-11 (×3): 5 mg via INTRAVENOUS
  Filled 2021-11-10 (×3): qty 1

## 2021-11-10 SURGICAL SUPPLY — 34 items
BENZOIN TINCTURE PRP APPL 2/3 (GAUZE/BANDAGES/DRESSINGS) ×2 IMPLANT
CHLORAPREP W/TINT 26ML (MISCELLANEOUS) ×3 IMPLANT
CLAMP CORD UMBIL (MISCELLANEOUS) ×1 IMPLANT
CLOTH BEACON ORANGE TIMEOUT ST (SAFETY) ×2 IMPLANT
DERMABOND ADVANCED (GAUZE/BANDAGES/DRESSINGS) ×1
DERMABOND ADVANCED .7 DNX12 (GAUZE/BANDAGES/DRESSINGS) IMPLANT
DRSG OPSITE POSTOP 4X10 (GAUZE/BANDAGES/DRESSINGS) ×2 IMPLANT
ELECT REM PT RETURN 9FT ADLT (ELECTROSURGICAL) ×2
ELECTRODE REM PT RTRN 9FT ADLT (ELECTROSURGICAL) ×1 IMPLANT
EXTRACTOR VACUUM M CUP 4 TUBE (SUCTIONS) IMPLANT
GLOVE BIOGEL PI IND STRL 7.0 (GLOVE) ×2 IMPLANT
GLOVE BIOGEL PI IND STRL 7.5 (GLOVE) ×2 IMPLANT
GLOVE BIOGEL PI INDICATOR 7.0 (GLOVE) ×2
GLOVE BIOGEL PI INDICATOR 7.5 (GLOVE) ×2
GLOVE ECLIPSE 7.5 STRL STRAW (GLOVE) ×2 IMPLANT
GOWN STRL REUS W/TWL LRG LVL3 (GOWN DISPOSABLE) ×6 IMPLANT
HEMOSTAT ARISTA ABSORB 3G PWDR (HEMOSTASIS) ×1 IMPLANT
KIT ABG SYR 3ML LUER SLIP (SYRINGE) IMPLANT
NDL HYPO 25X5/8 SAFETYGLIDE (NEEDLE) IMPLANT
NEEDLE HYPO 25X5/8 SAFETYGLIDE (NEEDLE) IMPLANT
NS IRRIG 1000ML POUR BTL (IV SOLUTION) ×2 IMPLANT
PACK C SECTION WH (CUSTOM PROCEDURE TRAY) ×2 IMPLANT
PAD OB MATERNITY 4.3X12.25 (PERSONAL CARE ITEMS) ×2 IMPLANT
PENCIL SMOKE EVAC W/HOLSTER (ELECTROSURGICAL) ×2 IMPLANT
RTRCTR C-SECT PINK 25CM LRG (MISCELLANEOUS) ×2 IMPLANT
STRIP CLOSURE SKIN 1/2X4 (GAUZE/BANDAGES/DRESSINGS) ×2 IMPLANT
SUT VIC AB 0 CTX 36 (SUTURE) ×5
SUT VIC AB 0 CTX36XBRD ANBCTRL (SUTURE) ×3 IMPLANT
SUT VIC AB 2-0 CT1 27 (SUTURE) ×1
SUT VIC AB 2-0 CT1 TAPERPNT 27 (SUTURE) ×1 IMPLANT
SUT VIC AB 4-0 KS 27 (SUTURE) ×2 IMPLANT
TOWEL OR 17X24 6PK STRL BLUE (TOWEL DISPOSABLE) ×2 IMPLANT
TRAY FOLEY W/BAG SLVR 14FR LF (SET/KITS/TRAYS/PACK) ×2 IMPLANT
WATER STERILE IRR 1000ML POUR (IV SOLUTION) ×2 IMPLANT

## 2021-11-10 NOTE — Progress Notes (Signed)
Cx still edematous, ~ 5.5 cms/-2.  MVUs adequate, FHR Cat 1.  Pt offered CS for failed IOL, wants to continue w/IOL. Encouragement given, will continue to proceed w/IOL.  ?

## 2021-11-10 NOTE — Anesthesia Postprocedure Evaluation (Signed)
Anesthesia Post Note ? ?Patient: Caitlin Gutierrez ? ?Procedure(s) Performed: CESAREAN SECTION ? ?  ? ?Patient location during evaluation: Mother Baby ?Anesthesia Type: Epidural ?Level of consciousness: awake and alert ?Pain management: pain level controlled ?Vital Signs Assessment: post-procedure vital signs reviewed and stable ?Respiratory status: spontaneous breathing, nonlabored ventilation and respiratory function stable ?Cardiovascular status: stable ?Postop Assessment: no headache, no backache and epidural receding ?Anesthetic complications: no ? ? ?No notable events documented. ? ?Last Vitals:  ?Vitals:  ? 11/10/21 1814 11/10/21 1914  ?BP:  (!) 134/95  ?Pulse: 82 85  ?Resp: 17   ?Temp:    ?SpO2: 100% 99%  ?  ?Last Pain:  ?Vitals:  ? 11/10/21 1739  ?TempSrc:   ?PainSc: 3   ? ? ?  ?  ?  ?  ?  ?  ? ?Sidharth Leverette ? ? ? ? ?

## 2021-11-10 NOTE — Discharge Summary (Addendum)
? ?  Postpartum Discharge Summary ? ?   ?Patient Name: Caitlin Gutierrez ?DOB: 1986/06/28 ?MRN: [id number] ? ?Date of admission: 11/08/2021 ?Delivery date:11/10/2021  ?Delivering provider: Truett Mainland  ?Date of discharge: 11/12/2021 ? ?Admitting diagnosis: Decreased fetal movement, third trimester, fetus 1 [O36.8131] ?Intrauterine pregnancy: [redacted]w[redacted]d    ?Secondary diagnosis:  Principal Problem: ?  Cesarean delivery delivered ?Active Problems: ?  [place] cell trait ([place] ?  Uterine fibroids affecting pregnancy ?  Alpha thalassemia silent carrier ?  Chlamydia infection affecting pregnancy ?  Decreased fetal movement, third trimester, fetus 1 ?  Gestational hypertension ?  Acute blood loss anemia ? ?Additional problems: None     ?Discharge diagnosis: Term Pregnancy Delivered, Gestational Hypertension, and PPH                                              ?Post partum procedures: None ?Augmentation: Pitocin, Cytotec, and IP Foley ?Complications: Intrauterine Inflammation or infection (Chorioamniotis) and Hemorrhage>10050m? ?Hospital course: Induction of Labor With Cesarean Section   ?[age].o. yo G1P1001 at 4045w2ds admitted to the hospital 11/08/2021 for induction of labor. Patient had a labor course significant for prolonged IOL. She developed a persistently swollen cervix that despite Benadryl and adequate contractions, she was not able to progress past 5.5 cm. The patient went for cesarean section due to Arrest of Dilation. Delivery details are as follows: ?Membrane Rupture Time/Date: 4:26 PM ,11/09/2021   ?Delivery Method:C-Section, Low Transverse  ?Details of operation can be found in separate operative note.  Patient had an uncomplicated postpartum course.  She was continued on Cefotetan for 24 hours after delivery due to development of Triple I in labor.  She remained afebrile after treatment.  She was started on Procardia 30 mg for BP control with good result. She will compete a course of Lasix upon discharge and follow  up in 1 week for a BP check.  Her hemoglobin on POD#1 was 7.6, down from 10.7.  She was asymptomatic from this and received IV Venofer while inpatient.  She is ambulating, tolerating a regular diet, passing flatus, and urinating well.  Patient is discharged home in stable condition on 11/12/21.     ? ?Newborn Data: ?Birth date:11/10/2021  ?Birth time:12:47 PM  ?Gender:Female  ?Living status:Living  ?Apgars:1 ,5  ?Weight:3610 g                               ? ?Magnesium Sulfate received: No ?BMZ received: No ?Rhophylac: N/A ?MMR: N/A ?T-DaP: Given prenatally ?Flu: No ?Transfusion: IV Venofer x 1 ? ?Physical exam  ?Vitals:  ? 11/11/21 0722 11/11/21 1445 11/11/21 1933 11/12/21 0515  ?BP: 125/78 116/68 115/75 123/81  ?Pulse: 86 98 95 (!) 101  ?Resp: '18 18 16 18  ' ?Temp:  98 ?F (36.7 ?C) 98.1 ?F (36.7 ?C) 98.2 ?F (36.8 ?C)  ?TempSrc:  Axillary Axillary Axillary  ?SpO2:      ?Weight:      ?Height:      ? ?General: alert, cooperative, and no distress ?Lochia: appropriate ?Uterine Fundus: firm ?Incision: Dressing is clean, dry, and intact ?DVT Evaluation: No evidence of DVT seen on physical exam. ?No significant calf/ankle edema. ? ?Labs: ?Lab Results  ?Component Value Date  ? WBC 14.3 (H) 11/11/2021  ? HGB 7.6 (L) 11/11/2021  ?  HCT 22.4 (L) 11/11/2021  ? MCV 81.2 11/11/2021  ? PLT 159 11/11/2021  ? ? ?  Latest Ref Rng & Units 11/08/2021  ?  6:19 PM  ?CMP  ?Glucose 70 - 99 mg/dL 87    ?BUN 6 - 20 mg/dL 8    ?Creatinine 0.44 - 1.00 mg/dL 0.81    ?Sodium 135 - 145 mmol/L 134    ?Potassium 3.5 - 5.1 mmol/L 4.2    ?Chloride 98 - 111 mmol/L 103    ?CO2 22 - 32 mmol/L 21    ?Calcium 8.9 - 10.3 mg/dL 9.6    ?Total Protein 6.5 - 8.1 g/dL 6.8    ?Total Bilirubin 0.3 - 1.2 mg/dL 0.4    ?Alkaline Phos 38 - 126 U/L 209    ?AST 15 - 41 U/L 16    ?ALT 0 - 44 U/L 10    ? ?Edinburgh Score: ?   ? View : No data to display.  ?  ?  ?  ? ? ? ?After visit meds:  ?Allergies as of 11/12/2021   ?No Known Allergies ?  ? ?  ?Medication List  ?  ? ?STOP  taking these medications   ? ?azithromycin 500 MG tablet ?Commonly known as: ZITHROMAX ?  ?promethazine 12.5 MG tablet ?Commonly known as: PHENERGAN ?  ? ?  ? ?TAKE these medications   ? ?acetaminophen 500 MG tablet ?Commonly known as: TYLENOL ?Take 2 tablets (1,000 mg total) by mouth every 8 (eight) hours as needed (pain). ?What changed:  ?when to take this ?reasons to take this ?  ?ferrous sulfate 325 (65 FE) MG tablet ?Commonly known as: FerrouSul ?Take 1 tablet (325 mg total) by mouth every other day. ?What changed: when to take this ?  ?furosemide 20 MG tablet ?Commonly known as: Lasix ?Take 1 tablet (20 mg total) by mouth daily for 5 days. ?  ?ibuprofen 600 MG tablet ?Commonly known as: ADVIL ?Take 1 tablet (600 mg total) by mouth every 6 (six) hours as needed (pain). ?  ?NIFEdipine 30 MG 24 hr tablet ?Commonly known as: ADALAT CC ?Take 1 tablet (30 mg total) by mouth daily. ?  ?One-A-Day Womens Prenatal 1 28-0.8-235 MG Caps ?Take 1 capsule by mouth every evening. ?  ?oxyCODONE 5 MG immediate release tablet ?Commonly known as: Oxy IR/ROXICODONE ?Take 1 tablet (5 mg total) by mouth every 4 (four) hours as needed for severe pain or breakthrough pain. ?  ? ?  ? ? ? ?Discharge home in stable condition ?Infant Feeding: Breast ?Infant Disposition: home with mother ?Discharge instruction: per After Visit Summary and Postpartum booklet. ?Activity: Advance as tolerated. Pelvic rest for 6 weeks.  ?Diet: routine diet ?Future Appointments: ?Future Appointments  ?Date Time Provider [place]  ?11/17/2021  1:20 PM [facility] NURSE [facility] None  ?12/31/2021  1:10 PM Kooistra, Mervyn Skeeters, CNM [facility] None  ? ?Follow up Visit: ?Message sent to [place] by Dr Higinio Plan ?Please schedule this patient for a In person postpartum visit in 6 weeks with the following provider: Any provider. ?Additional Postpartum F/U: Incision check 1 week and BP check 1 week  ?High risk pregnancy complicated by: HTN ?Delivery mode:  C-Section, Low  Transverse  ?Anticipated Birth Control:  Unsure ? ?11/12/2021 ?Gerrit Heck, MD ? ? ? ?Attestation of CNM Supervision of Resident: Evaluation and management procedures were performed by the [facility] Medicine Resident under my supervision. I was immediately available for direct supervision, assistance and direction throughout this encounter.  I also  confirm that I have verified the information documented in the resident?s note, and that I have also personally reperformed the pertinent components of the physical exam and all of the medical decision making activities.  I have also made any necessary editorial changes. ? ?Renee Harder, CNM ?11/12/2021 11:38 AM ? ? ?

## 2021-11-10 NOTE — Transfer of Care (Signed)
Immediate Anesthesia Transfer of Care Note ? ?Patient: Caitlin Gutierrez ? ?Procedure(s) Performed: CESAREAN SECTION ? ?Patient Location: PACU ? ?Anesthesia Type:Epidural ? ?Level of Consciousness: awake, alert  and oriented ? ?Airway & Oxygen Therapy: Patient Spontanous Breathing ? ?Post-op Assessment: Report given to RN and Post -op Vital signs reviewed and stable ? ?Post vital signs: Reviewed and stable ? ?Last Vitals:  ?Vitals Value Taken Time  ?BP 120/79 11/10/21 1349  ?Temp    ?Pulse 85 11/10/21 1352  ?Resp 22 11/10/21 1352  ?SpO2 100 % 11/10/21 1352  ?Vitals shown include unvalidated device data. ? ?Last Pain:  ?Vitals:  ? 11/10/21 1205  ?TempSrc: Axillary  ?PainSc:   ?   ? ?  ? ?Complications: No notable events documented. ?

## 2021-11-10 NOTE — Op Note (Signed)
Caitlin Gutierrez ?PROCEDURE DATE: 11/10/2021 ? ?PREOPERATIVE DIAGNOSIS: Intrauterine pregnancy at  52w2dweeks gestation; failure to progress: arrest of dilation ? ?POSTOPERATIVE DIAGNOSIS: The same, chorioamnionitis  ? ?PROCEDURE: Primary Low Transverse Cesarean Section ? ?SURGEON:  Dr. JLoma Boston? ?ASSISTANT: Dr. SDarrelyn Hillock ? ?INDICATIONS: Caitlin Jareckiis a 36y.o. G1P1001 at 35w2dcheduled for cesarean section secondary to failure to progress: arrest of dilation at 5 cm with persistent cervical swelling.  The risks of cesarean section discussed with the patient included but were not limited to: bleeding which may require transfusion or reoperation; infection which may require antibiotics; injury to bowel, bladder, ureters or other surrounding organs; injury to the fetus; need for additional procedures including hysterectomy in the event of a life-threatening hemorrhage; placental abnormalities wth subsequent pregnancies, incisional problems, thromboembolic phenomenon and other postoperative/anesthesia complications. The patient concurred with the proposed plan, giving informed written consent for the procedure.   ? ?FINDINGS:  Viable female infant in OP presentation.  Apgars 1, 5, and 8 at 1, 5, and 10 minutes respectively, weight 3610g.  Serosanguinous to purulent appearing amniotic fluid.  Intact placenta, three vessel cord.  Normal uterus, fallopian tubes and ovaries bilaterally. ? ?ANESTHESIA:  Epidural  ?INTRAVENOUS FLUIDS: 1,800 ml ?ESTIMATED BLOOD LOSS: 1345 ml ?URINE OUTPUT:  150 ml ?SPECIMENS: Placenta sent to pathology ?COMPLICATIONS: None immediate ? ?PROCEDURE IN DETAIL:  The patient received intravenous antibiotics and had sequential compression devices applied to her lower extremities while in the preoperative area.  She was then taken to the operating room where epidural anesthesia was found to be adequate. She was then placed in a dorsal supine position with a leftward tilt, and prepped and  draped in a sterile manner.  A foley catheter was already in place.  After an adequate timeout was performed, a Pfannenstiel skin incision was made with scalpel and carried through to the underlying layer of fascia. The fascia was incised in the midline and this incision was extended bilaterally bluntly and using the Mayo scissors. Kocher clamps were applied to the superior aspect of the fascial incision and the underlying rectus muscles were dissected off bluntly. A similar process was carried out on the inferior aspect of the facial incision. The rectus muscles were separated in the midline bluntly and the peritoneum was entered bluntly. An Alexis retractor was placed to aid in visualization of the uterus.  Attention was turned to the lower uterine segment where a transverse hysterotomy was made with a scalpel and extended bilaterally bluntly. The infant was successfully delivered, and cord was clamped and cut immediately and infant was handed over to awaiting neonatology team.  ? ?Uterine massage was then administered and the placenta delivered intact with three-vessel cord. The uterus was then cleared of clot and debris.  The hysterotomy was closed with 0 Vicryl in a running locked fashion, and an imbricating layer was also placed with a 0 Vicryl. Several figure of eight stitches were required for hemostasis. A hematoma formed within the lower uterine segment, however it was monitored after closing of the hysterotomy and remained stable. Arista was placed on the hysterotomy. The abdomen and the pelvis were cleared of all clot and debris and the AlUbaldo Glassingas removed. Hemostasis was confirmed on all surfaces.  The peritoneum was reapproximated using 2-0 vicryl running stitches. The fascia was then closed using 0 Vicryl in a running fashion. The skin was closed with 4-0 vicryl. The patient tolerated the procedure well. Sponge, lap, instrument and needle counts were  correct x 2. She was taken to the recovery room in  stable condition.   ? ? ?Caitlin Clan, DO ?11/10/2021 1:50 PM  ?

## 2021-11-10 NOTE — Progress Notes (Addendum)
Labor Progress Note  ? ?Cervix last checked around 0630 and unchanged with persistent cervix edema since about 0230. Just checked and still unchanged with notable cervical edema of the majority of the cervix (previously mainly 12-6 location). She has been have adequate MVUs with IV pit. Now ROM for 19 hours, however afebrile.  ? ?Offered primary C/S for failure to progress for which she has agreed. Dr. Nehemiah Settle made aware.  ? ?The risks of cesarean section discussed with the patient included but were not limited to: bleeding which may require transfusion or reoperation; infection which may require antibiotics; injury to bowel, bladder, ureters or other surrounding organs; injury to the fetus; need for additional procedures including hysterectomy in the event of a life-threatening hemorrhage; placental abnormalities wth subsequent pregnancies, incisional problems, thromboembolic phenomenon and other postoperative/anesthesia complications. The patient concurred with the proposed plan, giving informed written consent for the procedure. Anesthesia and OR aware. Preoperative prophylactic antibiotics and SCDs ordered on call to the OR. To OR when ready.   ? ?Caitlin Clan, DO  ? ?  ? ?

## 2021-11-10 NOTE — Progress Notes (Signed)
Called to room d/t bradycardia.  Arrived to room and HR was back to baseline (had dropped to 90's for ~ 60 seconds).  Pitocin was off, pt repositioned.  FHR has been Cat 1 since, Pit restarted.  Cx 5/90/-2.  ? ?

## 2021-11-10 NOTE — Progress Notes (Signed)
Patient Vitals for the past 4 hrs: ? BP Temp Temp src Pulse Resp SpO2  ?11/10/21 0231 (!) 149/71 -- -- 79 18 --  ?11/10/21 0201 125/69 -- -- 86 18 --  ?11/10/21 0131 125/72 -- -- 74 18 --  ?11/10/21 0100 134/60 -- -- 78 18 --  ?11/10/21 0031 135/66 -- -- 71 18 --  ?11/10/21 0001 133/62 98.5 ?F (36.9 ?C) Oral 78 18 --  ?11/09/21 2335 -- -- -- -- -- 99 %  ?11/09/21 2331 (!) 148/90 -- -- 93 18 --  ?11/09/21 2301 132/71 -- -- 73 18 --  ? ?FHR had another prolonged (3-4  minute) bradycardia in the 90's.  Pitocin off, now back to baseline w/o decels. Variability minimal to moderate.  MVUs have been >250, Now ~ 200.  Cx very swollen from 12-6 oclock.  Discussed w/Dr. Elonda Husky.  Will try benadryl/position changes.  When FHT allows, will restart pitocin and reassess after 2 hours.  ?

## 2021-11-10 NOTE — Progress Notes (Signed)
MD notified of pts elevated BPs. Procardia ordered with instructions to call for BP > 160/110 ?

## 2021-11-11 ENCOUNTER — Encounter: Payer: Medicaid Other | Admitting: Obstetrics and Gynecology

## 2021-11-11 LAB — CBC
HCT: 22.4 % — ABNORMAL LOW (ref 36.0–46.0)
Hemoglobin: 7.6 g/dL — ABNORMAL LOW (ref 12.0–15.0)
MCH: 27.5 pg (ref 26.0–34.0)
MCHC: 33.9 g/dL (ref 30.0–36.0)
MCV: 81.2 fL (ref 80.0–100.0)
Platelets: 159 10*3/uL (ref 150–400)
RBC: 2.76 MIL/uL — ABNORMAL LOW (ref 3.87–5.11)
RDW: 13.3 % (ref 11.5–15.5)
WBC: 14.3 10*3/uL — ABNORMAL HIGH (ref 4.0–10.5)
nRBC: 0 % (ref 0.0–0.2)

## 2021-11-11 MED ORDER — SODIUM CHLORIDE 0.9 % IV SOLN
500.0000 mg | Freq: Once | INTRAVENOUS | Status: AC
  Administered 2021-11-11: 500 mg via INTRAVENOUS
  Filled 2021-11-11: qty 25

## 2021-11-11 NOTE — Progress Notes (Signed)
Post Operative Day 1 ?Subjective: ?Doing well. No acute events overnight. Pain is controlled and bleeding is appropriate. She is eating, drinking, voiding, and ambulating without issue. She is breast feeding which is going well. She has no other concerns at this time. ? ?Objective: ?Blood pressure 125/78, pulse 86, temperature 98 ?F (36.7 ?C), temperature source Oral, resp. rate 18, height '5\' 4"'$  (1.626 m), weight 88.2 kg, last menstrual period 02/01/2021, SpO2 100 %, unknown if currently breastfeeding. ? ?Physical Exam:  ?General: alert, cooperative, and no distress ?Lochia: appropriate ?Uterine Fundus: firm and below umbilicus  ?Incision: healing well, no significant drainage, dressing is clean, dry, and intact  ?DVT Evaluation: no LE edema or calf tenderness to palpation ? ?Recent Labs  ?  11/10/21 ?1812 11/11/21 ?1610  ?HGB 9.2* 7.6*  ?HCT 25.7* 22.4*  ? ? ?Assessment/Plan: ?Caitlin Gutierrez is a 36 y.o. G1P1001 on POD# 1 s/p pLTCS. ? ?Progressing well. Meeting postpartum milestones. VSS. Continue routine postpartum care. ? ?#Acute blood loss anemia: ?- Hgb 10.7>9.2>7.6 ?- Asymptomatic  ?- Will give IV Venofer today for supplementation  ? ?#Triple I:  ?- No fevers ?- Will continue Cefotetan for 24 hours after delivery  ? ?#Gestational HTN:  ?- Started on Procardia 30 mg postpartum ?- BP now within normal range ?- Will continue current dose for now and adjust as necessary  ?- Plan to add Lasix prior to discharge  ? ?Feeding: Breast ?Contraception: Unsure  ?Circumcision: Desires, consented at bedside this AM ? ?Dispo: Plan for discharge on POD#2-3.  ? ? LOS: 3 days  ? ?Genia Del, MD  ?11/11/2021, 9:35 AM  ? ? ?

## 2021-11-11 NOTE — Lactation Note (Signed)
This note was copied from a baby's chart. ?Lactation Consultation Note ? ?Patient Name: Caitlin Gutierrez ?Today's Date: 11/11/2021 ?Reason for consult: Initial assessment;1st time breastfeeding;Primapara;Term ?Age:35 hours ? ?LC in to visit with P45 Mom of term baby delivered by C/S.  Neonatal Code Blue called.  Baby left STS with Mom after 10 mins.   ? ?Mom desires to exclusively breastfeed for 6 months. ? ?Mom reports baby fed a lot last night.  Output good, weight loss at 1%. ? ?Baby swaddled and dressed in Mom's arms.  While teaching basic breastfeeding in the first days of life, baby noted to show some subtle feeding cues.  Offered to assist with a feeding. ? ?Showed parents the benefit of STS, FOB was worried that room wasn't warm enough.  Reassurance provided.  Reviewed breast massage and hand expression, colostrum drops expressed easily.  Positioned baby in football hold.  Assisted Mom in supporting her breast and baby's head securely.  Baby opens his mouth widely and latched beautifully.  Baby sucked with deep jaw extensions and pauses.  Showed Mom how to use breast compression to increase milk transfer.  Mom wanting to pull her breast away from baby's nose, encouraged her to pull baby's chin into breast to allow room for nose.  ? ?Baby became sleepy and came off after 5 mins.  Nipple rounded.  Assisted Mom in placing baby STS on her chest.  Baby relaxed and sleeping. ? ?Encouraged STS and feeding often with cues. ?Mom knows she can call prn for assistance. ? ?Maternal Data ?Has patient been taught Hand Expression?: Yes ?Does the patient have breastfeeding experience prior to this delivery?: No ? ?Feeding ?Mother's Current Feeding Choice: Breast Milk ? ?LATCH Score ?Latch: Grasps breast easily, tongue down, lips flanged, rhythmical sucking. ? ?Audible Swallowing: A few with stimulation ? ?Type of Nipple: Everted at rest and after stimulation ? ?Comfort (Breast/Nipple): Soft / non-tender ? ?Hold  (Positioning): Assistance needed to correctly position infant at breast and maintain latch. ? ?LATCH Score: 8 ? ? ?Interventions ?Interventions: Breast feeding basics reviewed;Assisted with latch;Skin to skin;Breast massage;Hand express;Breast compression;Adjust position;Support pillows;Position options;LC Services brochure ? ?Consult Status ?Consult Status: Follow-up ?Date: 11/12/21 ?Follow-up type: In-patient ? ? ? ?Tilda Burrow E ?11/11/2021, 9:16 AM ? ? ? ?

## 2021-11-12 ENCOUNTER — Other Ambulatory Visit (HOSPITAL_COMMUNITY): Payer: Self-pay

## 2021-11-12 LAB — SURGICAL PATHOLOGY

## 2021-11-12 MED ORDER — ACETAMINOPHEN 500 MG PO TABS
1000.0000 mg | ORAL_TABLET | Freq: Three times a day (TID) | ORAL | 0 refills | Status: DC | PRN
  Filled 2021-11-12: qty 60, 10d supply, fill #0

## 2021-11-12 MED ORDER — NIFEDIPINE ER 30 MG PO TB24
30.0000 mg | ORAL_TABLET | Freq: Every day | ORAL | 0 refills | Status: DC
  Filled 2021-11-12: qty 30, 30d supply, fill #0

## 2021-11-12 MED ORDER — OXYCODONE HCL 5 MG PO TABS
5.0000 mg | ORAL_TABLET | ORAL | 0 refills | Status: DC | PRN
  Filled 2021-11-12: qty 20, 4d supply, fill #0

## 2021-11-12 MED ORDER — IBUPROFEN 600 MG PO TABS
600.0000 mg | ORAL_TABLET | Freq: Four times a day (QID) | ORAL | 0 refills | Status: DC | PRN
  Filled 2021-11-12: qty 40, 10d supply, fill #0

## 2021-11-12 MED ORDER — FUROSEMIDE 20 MG PO TABS
20.0000 mg | ORAL_TABLET | Freq: Every day | ORAL | 0 refills | Status: DC
  Filled 2021-11-12: qty 5, 5d supply, fill #0

## 2021-11-12 NOTE — Lactation Note (Signed)
This note was copied from a baby's chart. ?Lactation Consultation Note ? ?Patient Name: Caitlin Gutierrez ?Today's Date: 11/12/2021 ?Reason for consult: Follow-up assessment;1st time breastfeeding;Primapara;Term;Infant weight loss ?Age:35 hours ? ?Visited with mom of 39 hours old FT female, she's  P1, mom and baby are going home today. Reviewed discharge education, lactogenesis II, cluster feeding, pumping schedule and expectations. Mom voiced she started supplementing with formula this morning because "nothing came out", re-educated on baby's stomach size. Reviewed supplementation amounts, and encouraged her to pump whenever baby is getting a bottle of formula to protect her supply. ? ?Mom's support person (female visitor) voiced that mom's plan is to do more breastfeeding, that she was just using formula in the meantime until her milk comes in. Family aware of [place] OP services if they need to be followed up in person, otherwise they have our # and will call if any questions/concerns arise. Encouraged to continue putting baby to breast 8-12 times/24 hours and to supplement with her EBM first prior using formula. All questions and concerns answered, family to contact [facility] services PRN. ? ?Maternal Data ? Mother's supply is WNL ? ?Feeding ?Mother's Current Feeding Choice: Breast Milk and Formula ? ?Lactation Tools Discussed/Used ?Tools: Pump ?Breast pump type: Double-Electric Breast Pump ?Pump Education: Setup, frequency, and cleaning;Milk Storage ?Reason for Pumping: mother's request ?Pumping frequency: after feedings at the breast ?Pumped volume: 5 mL ? ?Interventions ?Interventions: Breast feeding basics reviewed;DEBP;Education ? ?Discharge ?Discharge Education: Engorgement and breast care;Warning signs for feeding baby;Outpatient recommendation ?Pump: DEBP;Personal (DEBP at home) ? ?Consult Status ?Consult Status: Complete ?Date: 11/12/21 ?Follow-up type: Call as needed ? ? ?Giovoni Bunch S Olie Scaffidi ?11/12/2021, 12:41 PM ? ? ? ?

## 2021-11-17 ENCOUNTER — Ambulatory Visit (INDEPENDENT_AMBULATORY_CARE_PROVIDER_SITE_OTHER): Payer: Medicaid Other

## 2021-11-17 ENCOUNTER — Other Ambulatory Visit: Payer: Self-pay

## 2021-11-17 VITALS — BP 121/76 | HR 89

## 2021-11-17 DIAGNOSIS — Z5189 Encounter for other specified aftercare: Secondary | ICD-10-CM

## 2021-11-17 MED ORDER — BLOOD PRESSURE KIT DEVI: 1.0000 | 0 refills | Status: DC | PRN

## 2021-11-17 NOTE — Progress Notes (Signed)
Patient was assessed and managed by nursing staff during this encounter. I have reviewed the chart and agree with the documentation and plan. I have also made any necessary editorial changes. ? ?Emeterio Reeve, MD ?11/17/2021 5:04 PM  ? ?

## 2021-11-17 NOTE — Progress Notes (Signed)
..  Subjective:  ?Cambrea Kirt is a 36 y.o. female here for BP and incision check; c-section delivery on 11/10/21. Pt denies any complications ? ?Hypertension ROS: taking medications as instructed, no medication side effects noted, no TIA's, no chest pain on exertion, no dyspnea on exertion, and no swelling of ankles.  ?Honeycomb removed today by patient prior to visit. Incision is intact with no signs of infection. Advised pt to keep incision clean and dry, pt agreed. ? ?Objective:  ?BP 121/76   Pulse 89   LMP 02/01/2021   ?Appearance alert, well appearing, and in no distress. ?General exam BP noted to be well controlled today in office.  ? ? ?Assessment:   ?Blood Pressure well controlled.  ? ?Plan:  ?Current treatment plan is effective, no change in therapy.Marland Kitchen ?PP is scheduled for 12/31/21 ?

## 2021-11-23 ENCOUNTER — Telehealth: Payer: Self-pay

## 2021-11-23 NOTE — Telephone Encounter (Signed)
Patient called stating that she had a severe headache yesterday that started around 4 am and lasted until about 1pm. She states that she took an oxycodone that she was prescribed for post c-section pain which resolved her headache. Patient states that she is unable to check her BP at this due to not having a cuff at home. Denies having any visual changes or swelling. Patient advised to monitor sx and if sx return to call the office to schedule a bp check or go to MAU for evaluation. Patient verbalized understanding. ?

## 2021-12-02 ENCOUNTER — Telehealth: Payer: Self-pay | Admitting: *Deleted

## 2021-12-02 NOTE — Telephone Encounter (Signed)
Received call from [facility] nurse regarding pt BP at home visit- 138/98. ?Pt had stated she had not taken medication nor had anything to eat today. ? ?Attempt to contact pt to discuss. ?No answer, LVM. ?

## 2021-12-03 NOTE — Telephone Encounter (Signed)
Patient returned call to nurse line.  I asked her to check her BP while we were on the phone.  Her BP was 132/90 .  Patient denies any headache, pain or swelling.  She will check her BP again this afternoon and call the office with those readings.  ? ?Advised patient to go to MAU for evaluation should she develop headache or her BP be above 140/90.  ? ? ?

## 2021-12-31 ENCOUNTER — Ambulatory Visit (INDEPENDENT_AMBULATORY_CARE_PROVIDER_SITE_OTHER): Payer: PRIVATE HEALTH INSURANCE | Admitting: Student

## 2021-12-31 ENCOUNTER — Encounter: Payer: Self-pay | Admitting: Student

## 2021-12-31 DIAGNOSIS — Z8669 Personal history of other diseases of the nervous system and sense organs: Secondary | ICD-10-CM

## 2021-12-31 HISTORY — DX: Personal history of other diseases of the nervous system and sense organs: Z86.69

## 2021-12-31 MED ORDER — NORETHINDRONE 0.35 MG PO TABS: 1.0000 | ORAL_TABLET | Freq: Every day | ORAL | 11 refills | Status: DC

## 2021-12-31 MED ORDER — NORETHINDRONE ACET-ETHINYL EST 1.5-30 MG-MCG PO TABS: 1.0000 | ORAL_TABLET | Freq: Every day | ORAL | 11 refills | Status: DC

## 2021-12-31 NOTE — Progress Notes (Addendum)
.. ? ? ?Post Partum Visit Note ? ?Caitlin Gutierrez is a 36 y.o. G24P1001 female who presents for a postpartum visit. She is 7 weeks postpartum following a primary cesarean section.  I have fully reviewed the prenatal and intrapartum course. The delivery was at 40.2 gestational weeks.  Anesthesia: epidural. Postpartum course has been good. Baby is doing well Baby is feeding by both breast and bottle - Gerber . Bleeding no bleeding. Bowel function is normal. Bladder function is normal. Patient is not sexually active. Contraception method is none. Postpartum depression screening: negative. ? ? ?The pregnancy intention screening data noted above was reviewed. Potential methods of contraception were discussed. The patient elected to proceed with No data recorded. ? ? Edinburgh Postnatal Depression Scale - 12/31/21 1322   ? ?  ? Edinburgh Postnatal Depression Scale:  In the Past 7 Days  ? I have been able to laugh and see the funny side of things. 0   ? I have looked forward with enjoyment to things. 0   ? I have blamed myself unnecessarily when things went wrong. 0   ? I have been anxious or worried for no good reason. 0   ? I have felt scared or panicky for no good reason. 0   ? Things have been getting on top of me. 0   ? I have been so unhappy that I have had difficulty sleeping. 0   ? I have felt sad or miserable. 0   ? I have been so unhappy that I have been crying. 0   ? The thought of harming myself has occurred to me. 0   ? Edinburgh Postnatal Depression Scale Total 0   ? ?  ?  ? ?  ? ? ?Health Maintenance Due  ?Topic Date Due  ? COVID-19 Vaccine (3 - Booster for Pfizer series) 01/17/2020  ? ? ?The following portions of the patient's history were reviewed and updated as appropriate: allergies, current medications, past family history, past medical history, past social history, past surgical history, and problem list. ? ?Review of Systems ?Pertinent items are noted in HPI. ? ?Objective:  ?BP 131/86   Pulse 72   Wt  176 lb 3.2 oz (79.9 kg)   LMP 02/01/2021   Breastfeeding Yes   BMI 30.24 kg/m?   ? ?General:  alert and cooperative  ? Breasts:  normal  ?Lungs: clear to auscultation bilaterally  ?Heart:  regular rate and rhythm, S1, S2 normal, no murmur, click, rub or gallop  ?Abdomen: soft, non-tender; bowel sounds normal; no masses,  no organomegaly   ?Wound well approximated incision  ?GU exam:  not indicated  ?     ?Assessment:  ? ?1. Postpartum exam   ? ? ? ?Healthy  postpartum exam.  ? ?Plan:  ? ?Essential components of care per ACOG recommendations: ? ?1.  Mood and well being: Patient with negative depression screening today. Reviewed local resources for support.  ?- Patient tobacco use? No.   ?- hx of drug use? No.   ? ?2. Infant care and feeding:  ?-Patient currently breastmilk feeding?  Yes with bottle too  ?-Social determinants of health (SDOH) reviewed in EPIC. No concernsThe following needs were identified= none ? ?3. Sexuality, contraception and birth spacing ?- Patient does want a pregnancy in the next year.  Desired family size is 2 children.  ?- Reviewed reproductive life planning. Reviewed contraceptive methods based on pt preferences and effectiveness.  Patient desired Oral Contraceptive today.  She  reports that she has a history of headaches with flashing lights; she will start with POP ?- Discussed birth spacing of 18 months ? ?4. Sleep and fatigue ?-Encouraged family/partner/community support of 4 hrs of uninterrupted sleep to help with mood and fatigue ? ?5. Physical Recovery  ?- Discussed patients delivery and complications. She describes her labor as bad. ?- Patient had a C-section failure to progress. Patient had a  no  laceration. Perineal healing reviewed. Patient expressed understanding ?- Patient has urinary incontinence? No. ?- Patient is safe to resume physical and sexual activity ? ?6.  Health Maintenance ?- HM due items addressed Yes ?- Last pap smear  ?Diagnosis  ?Date Value Ref Range Status   ?05/04/2021   Final  ? - Negative for intraepithelial lesion or malignancy (NILM)  ? Pap smear not done at today's visit.  ?-Breast Cancer screening indicated? No.  ? ?7. Chronic Disease/Pregnancy Condition follow up: None ?-patient states that she checks her BP at home and it is consistently in the 120s/70s; she thinks it is elevated today due to baby not sleeping and she feels stressed.  ?- PCP follow up ? ?Starr Lake, CNM ?[facility] ? ?

## 2021-12-31 NOTE — Patient Instructions (Signed)
-  pick up your prescription for [facility] at the pharmacy ?

## 2022-03-13 ENCOUNTER — Emergency Department (HOSPITAL_COMMUNITY): Payer: Medicaid Other

## 2022-03-13 ENCOUNTER — Encounter (HOSPITAL_COMMUNITY): Payer: Self-pay | Admitting: Emergency Medicine

## 2022-03-13 ENCOUNTER — Emergency Department (HOSPITAL_COMMUNITY)
Admission: EM | Admit: 2022-03-13 | Discharge: 2022-03-13 | Disposition: A | Discharge status: Home / Self Care | Payer: Medicaid Other | Attending: Emergency Medicine | Admitting: Emergency Medicine

## 2022-03-13 DIAGNOSIS — J181 Lobar pneumonia, unspecified organism: Secondary | ICD-10-CM | POA: Diagnosis not present

## 2022-03-13 DIAGNOSIS — R0789 Other chest pain: Secondary | ICD-10-CM

## 2022-03-13 DIAGNOSIS — E876 Hypokalemia: Secondary | ICD-10-CM | POA: Diagnosis not present

## 2022-03-13 DIAGNOSIS — J189 Pneumonia, unspecified organism: Secondary | ICD-10-CM

## 2022-03-13 LAB — CBC WITH DIFFERENTIAL/PLATELET
Abs Immature Granulocytes: 0.01 10*3/uL (ref 0.00–0.07)
Basophils Absolute: 0 10*3/uL (ref 0.0–0.1)
Basophils Relative: 1 %
Eosinophils Absolute: 0.3 10*3/uL (ref 0.0–0.5)
Eosinophils Relative: 5 %
HCT: 36.4 % (ref 36.0–46.0)
Hemoglobin: 12.2 g/dL (ref 12.0–15.0)
Immature Granulocytes: 0 %
Lymphocytes Relative: 39 %
Lymphs Abs: 2.1 10*3/uL (ref 0.7–4.0)
MCH: 25.6 pg — ABNORMAL LOW (ref 26.0–34.0)
MCHC: 33.5 g/dL (ref 30.0–36.0)
MCV: 76.5 fL — ABNORMAL LOW (ref 80.0–100.0)
Monocytes Absolute: 0.5 10*3/uL (ref 0.1–1.0)
Monocytes Relative: 10 %
Neutro Abs: 2.6 10*3/uL (ref 1.7–7.7)
Neutrophils Relative %: 45 %
Platelets: 270 10*3/uL (ref 150–400)
RBC: 4.76 MIL/uL (ref 3.87–5.11)
RDW: 14.6 % (ref 11.5–15.5)
WBC: 5.5 10*3/uL (ref 4.0–10.5)
nRBC: 0 % (ref 0.0–0.2)

## 2022-03-13 LAB — I-STAT BETA HCG BLOOD, ED (MC, WL, AP ONLY): I-stat hCG, quantitative: <5 m[IU]/mL (ref <5)

## 2022-03-13 LAB — BASIC METABOLIC PANEL
Anion gap: 5 (ref 5–15)
BUN: 9 mg/dL (ref 6–20)
CO2: 26 mmol/L (ref 22–32)
Calcium: 9.7 mg/dL (ref 8.9–10.3)
Chloride: 108 mmol/L (ref 98–111)
Creatinine, Ser: 0.64 mg/dL (ref 0.44–1.00)
GFR, Estimated: >60 mL/min (ref >60)
Glucose, Bld: 154 mg/dL — ABNORMAL HIGH (ref 70–99)
Potassium: 3.4 mmol/L — ABNORMAL LOW (ref 3.5–5.1)
Sodium: 139 mmol/L (ref 135–145)

## 2022-03-13 LAB — D-DIMER, QUANTITATIVE: D-Dimer, Quant: 0.77 ug/mL-FEU — ABNORMAL HIGH (ref 0.00–0.50)

## 2022-03-13 LAB — BRAIN NATRIURETIC PEPTIDE: B Natriuretic Peptide: 5.3 pg/mL (ref 0.0–100.0)

## 2022-03-13 MED ORDER — IOHEXOL 350 MG/ML SOLN
80.0000 mL | Freq: Once | INTRAVENOUS | Status: AC | PRN
  Administered 2022-03-13: 80 mL via INTRAVENOUS

## 2022-03-13 MED ORDER — AZITHROMYCIN 250 MG PO TABS: 250.0000 mg | ORAL_TABLET | Freq: Every day | ORAL | 0 refills | Status: DC

## 2022-03-13 NOTE — ED Provider Triage Note (Signed)
Emergency Medicine Provider Triage Evaluation Note  Tsering Leaman , a 36 y.o. female  was evaluated in triage.  Pt complains of mid sternal CP intermittent today, radiates to right side chest, pain is worse with taking deep breath. Non smoker, is on OCP, no PE/DVT hx Review of Systems  Positive: CP, SHOB Negative: N/v/diaphoresis  Physical Exam  BP 134/77   Pulse 92   Temp 98.1 F (36.7 C) (Oral)   Resp 16   SpO2 100%  Gen:   Awake, no distress   Resp:  Normal effort  MSK:   Moves extremities without difficulty  Other:  No lower ext edema.   Medical Decision Making  Medically screening exam initiated at 4:39 PM.  Appropriate orders placed.  Aujanae Mccullum was informed that the remainder of the evaluation will be completed by another provider, this initial triage assessment does not replace that evaluation, and the importance of remaining in the ED until their evaluation is complete.     Tacy Learn, PA-C 03/13/22 1640

## 2022-03-13 NOTE — Discharge Instructions (Addendum)
He was seen in the emergency department for evaluation of chest pain.  You were given a prescription for azithromycin.  Based on literature review azithromycin is present in breastmilk but does not cause any issues.  We recommend taking this medication as prescribed and following up with her primary care provider in the coming days as needed for symptom recheck.

## 2022-03-13 NOTE — ED Notes (Signed)
EDP at bedside  

## 2022-03-13 NOTE — ED Notes (Signed)
Discharge instructions including prescription discussed with pt. Pt verbalized understanding with no questions at this time. Pt to go home with s/o at bedside.

## 2022-03-13 NOTE — ED Provider Notes (Signed)
[facility] EMERGENCY DEPARTMENT Provider Note   CSN: [id number] Arrival date & time: 03/13/22  1616     History  Chief Complaint  Patient presents with   Chest Pain    Caitlin Gutierrez is a 36 y.o. female.   Chest Pain  Patient is a 36 year old female with a history of carrier abnormality in the, sickle cell trait who presents emergency department for evaluation of chest pain.  Patient states that this morning after she awoke she noted chest pain on the right side of her chest radiating to her right shoulder.  Her pain was worse with arm extension movements.  She denied any associated shortness of breath, cough, fevers, or other upper respiratory infection symptoms.  She denies any belly pain or any urinary frequency/urgency or dysuria.  Of note patient did give birth on 11/10/2021 and is currently breast-feeding.    Home Medications Prior to Admission medications   Medication Sig Start Date End Date Taking? Authorizing Provider  azithromycin (ZITHROMAX) 250 MG tablet Take 1 tablet (250 mg total) by mouth daily. Take first 2 tablets together, then 1 every day until finished. 03/13/22  Yes Eldana Isip, Shanon Brow, MD  acetaminophen (TYLENOL) 500 MG tablet Take 2 tablets (1,000 mg total) by mouth every 8 (eight) hours as needed (pain). Patient not taking: Reported on 12/31/2021 11/12/21   Gerrit Heck, MD  Blood Pressure Monitoring (BLOOD PRESSURE KIT) DEVI 1 kit by Does not apply route as needed. Patient not taking: Reported on 12/31/2021 11/17/21   Woodroe Mode, MD  ferrous sulfate (FERROUSUL) 325 (65 FE) MG tablet Take 1 tablet (325 mg total) by mouth every other day. 09/16/21   Burleson, Rona Ravens, NP  furosemide (LASIX) 20 MG tablet Take 1 tablet (20 mg total) by mouth daily for 5 days. Patient not taking: Reported on 11/17/2021 11/12/21 11/17/21  Gerrit Heck, MD  ibuprofen (ADVIL) 600 MG tablet Take 1 tablet (600 mg total) by mouth every 6 (six) hours as needed  (pain). Patient not taking: Reported on 12/31/2021 11/12/21   Gerrit Heck, MD  NIFEdipine (ADALAT CC) 30 MG 24 hr tablet Take 1 tablet (30 mg total) by mouth daily. 11/12/21   Gerrit Heck, MD  norethindrone (MICRONOR) 0.35 MG tablet Take 1 tablet (0.35 mg total) by mouth daily. 12/31/21   Starr Lake, CNM  oxyCODONE (OXY IR/ROXICODONE) 5 MG immediate release tablet Take 1 tablet (5 mg total) by mouth every 4 (four) hours as needed for severe pain or breakthrough pain. 11/12/21   Gerrit Heck, MD  Prenat-Fe Carbonyl-FA-Omega 3 (ONE-A-DAY WOMENS PRENATAL 1) 28-0.8-235 MG CAPS Take 1 capsule by mouth every evening.    [provider]      Allergies    Patient has no known allergies.    Review of Systems   Review of Systems  Cardiovascular:  Positive for chest pain.    Physical Exam Updated Vital Signs BP 133/75   Pulse 76   Temp 98.5 F (36.9 C) (Oral)   Resp 16   SpO2 100%  Physical Exam Vitals and nursing note reviewed.  Constitutional:      General: She is not in acute distress.    Appearance: She is well-developed.  Eyes:     Extraocular Movements: Extraocular movements intact.     Conjunctiva/sclera: Conjunctivae normal.     Pupils: Pupils are equal, round, and reactive to light.  Neck:     Vascular: No JVD.  Cardiovascular:     Rate and  Rhythm: Normal rate and regular rhythm.     Heart sounds: No murmur heard. Pulmonary:     Effort: Pulmonary effort is normal. No respiratory distress.     Breath sounds: Normal breath sounds. No wheezing, rhonchi or rales.  Abdominal:     Palpations: Abdomen is soft.     Tenderness: There is no abdominal tenderness. There is no guarding or rebound.  Musculoskeletal:        General: No swelling.     Cervical back: Neck supple.     Right lower leg: No tenderness. No edema.     Left lower leg: No tenderness. No edema.  Skin:    General: Skin is warm and dry.     Capillary Refill: Capillary refill  takes less than 2 seconds.     Findings: No rash.  Neurological:     Mental Status: She is alert.  Psychiatric:        Mood and Affect: Mood normal.     ED Results / Procedures / Treatments   Labs (all labs ordered are listed, but only abnormal results are displayed) Labs Reviewed  BASIC METABOLIC PANEL - Abnormal; Notable for the following components:      Result Value   Potassium 3.4 (*)    Glucose, Bld 154 (*)    All other components within normal limits  CBC WITH DIFFERENTIAL/PLATELET - Abnormal; Notable for the following components:   MCV 76.5 (*)    MCH 25.6 (*)    All other components within normal limits  D-DIMER, QUANTITATIVE - Abnormal; Notable for the following components:   D-Dimer, Quant 0.77 (*)    All other components within normal limits  BRAIN NATRIURETIC PEPTIDE  I-STAT BETA HCG BLOOD, ED ([facility], [facility], [facility] ONLY)    EKG EKG Interpretation  Date/Time:  Saturday March 13 2022 16:37:12 EDT Ventricular Rate:  87 PR Interval:  158 QRS Duration: 82 QT Interval:  358 QTC Calculation: 430 R Axis:   80 Text Interpretation: Normal sinus rhythm Nonspecific T wave abnormality Abnormal ECG No previous ECGs available Confirmed by Wandra Arthurs [phone]) on 03/13/2022 10:53:53 PM  Radiology CT Angio Chest PE W/Cm &/Or Wo Cm  Result Date: 03/13/2022 CLINICAL DATA:  Shortness of breath, chest pain, positive D-dimer EXAM: CT ANGIOGRAPHY CHEST WITH CONTRAST TECHNIQUE: Multidetector CT imaging of the chest was performed using the standard protocol during bolus administration of intravenous contrast. Multiplanar CT image reconstructions and MIPs were obtained to evaluate the vascular anatomy. RADIATION DOSE REDUCTION: This exam was performed according to the departmental dose-optimization program which includes automated exposure control, adjustment of the mA and/or kV according to patient size and/or use of iterative reconstruction technique. CONTRAST:  55m OMNIPAQUE IOHEXOL 350 MG/ML  SOLN COMPARISON:  Chest radiograph dated 03/13/2022 FINDINGS: Cardiovascular: Satisfactory opacification the bilateral pulmonary arteries to the segmental level. No evidence of pulmonary embolism. Although not tailored for evaluation of the thoracic aorta, there is no evidence of thoracic aortic aneurysm or dissection. The heart is normal in size.  No pericardial effusion. Mediastinum/Nodes: Suspected residual thymic tissue along the anterior mediastinum. No suspicious mediastinal lymphadenopathy. Visualized thyroid is unremarkable. Lungs/Pleura: 8 mm irregular nodule with mild ground-glass opacity in the right upper lobe (series 6/image 22), favored to be infectious. Adjacent 4 mm irregular nodule with mild ground-glass opacity (series 6/image 22). Mild lower lobe atelectasis. No focal consolidation. No pleural effusion or pneumothorax. Upper Abdomen: Visualized upper abdomen is grossly unremarkable. Musculoskeletal: Visualized osseous structures are within normal limits.  Review of the MIP images confirms the above findings. IMPRESSION: No evidence of pulmonary embolism. Two right upper lobe nodules measuring up to 8 mm which favor mild infection. If clinically warranted, consider follow-up CT chest in 3-6 months to document resolution. Electronically Signed   By: Julian Hy M.D.   On: 03/13/2022 21:30   DG Chest 2 View  Result Date: 03/13/2022 CLINICAL DATA:  Shortness of breath and chest pain today. EXAM: CHEST - 2 VIEW COMPARISON:  None Available. FINDINGS: The heart size and mediastinal contours are within normal limits. Both lungs are clear. The visualized skeletal structures are unremarkable. IMPRESSION: No active cardiopulmonary disease. Electronically Signed   By: Lucienne Capers M.D.   On: 03/13/2022 17:45    Procedures Procedures    Medications Ordered in ED Medications  iohexol (OMNIPAQUE) 350 MG/ML injection 80 mL (80 mLs Intravenous Contrast Given 03/13/22 2123)    ED Course/  Medical Decision Making/ A&P                           Medical Decision Making Problems Addressed: Chest wall pain: complicated acute illness or injury with systemic symptoms Community acquired pneumonia of right upper lobe of lung: complicated acute illness or injury with systemic symptoms  Amount and/or Complexity of Data Reviewed Independent Historian: spouse External Data Reviewed: labs, radiology and notes.  Risk Prescription drug management.   Patient is a 36 year old female who presents emergency department as above.  On initial presentation patient's vital signs are stable she is afebrile.  Physical exam did reveal some tenderness to palpation along the right side of her chest along distribution of the pectoralis minor muscle.  Her pain is made worse with movement.  She denies a family history of cardiac issues and has minimal underlying risk factors regarding a cardiac etiology.  Baseline laboratory work and imaging studies were ordered from triage.  BMP showed a potassium of 3.4 but was otherwise grossly unremarkable.  BNP at 5.3.  CBC without evidence of leukocytosis or anemia.  D-dimer mildly elevated at 0.77.  hCG negative.  Chest x-ray did not show any acute cardiopulmonary abnormalities.  In the setting of the D-dimer being elevated a CT angio of the chest was performed which showed no evidence of PE but did show to right upper lobe nodules measuring up to 8 mm favoring mild infection.  In this circumstance patient was felt appropriate for further management in the outpatient setting.  Findings were discussed with patient and she verbalized understanding.  She will be prescribed a Z-Pak to take in the outpatient setting and was instructed on appropriate use of Tylenol at home.  Z-Pak was found to be present in the breastmilk based on literature search but was not harmful to the infant.  All findings and plan were discussed with family and patient at bedside and they verbalized  understanding were agreeable.  Patient discharged in the ED in stable condition.        Final Clinical Impression(s) / ED Diagnoses Final diagnoses:  Chest wall pain  Community acquired pneumonia of right upper lobe of lung    Rx / DC Orders ED Discharge Orders          Ordered    azithromycin (ZITHROMAX) 250 MG tablet  Daily        03/13/22 2308              Nelta Numbers, MD 03/13/22 2352    Darl Householder,  Lujean Rave, MD 03/14/22 [phone]

## 2022-05-04 ENCOUNTER — Other Ambulatory Visit: Payer: Self-pay

## 2022-05-04 ENCOUNTER — Other Ambulatory Visit (HOSPITAL_COMMUNITY): Payer: Self-pay

## 2022-06-08 ENCOUNTER — Other Ambulatory Visit (HOSPITAL_COMMUNITY): Payer: Self-pay

## 2022-08-11 ENCOUNTER — Other Ambulatory Visit: Payer: Self-pay | Admitting: Internal Medicine

## 2022-08-12 LAB — INFLUENZA A AND B AG, IMMUNOASSAY
INFLUENZA A ANTIGEN: NOT DETECTED
INFLUENZA B ANTIGEN: NOT DETECTED
MICRO NUMBER:: 14342082
SPECIMEN QUALITY:: ADEQUATE

## 2022-08-12 LAB — SARS-COV-2 RNA,(COVID-19) QUALITATIVE NAAT: SARS CoV2 RNA: DETECTED — AB

## 2022-12-10 IMAGING — US US MFM OB FOLLOW-UP
1 series · 13 of 28 positions shown · non-contrast
Comparison: none

[Series 1: us mfm ob follow-up · 13 of 70 slices shown]
[im 3/70]
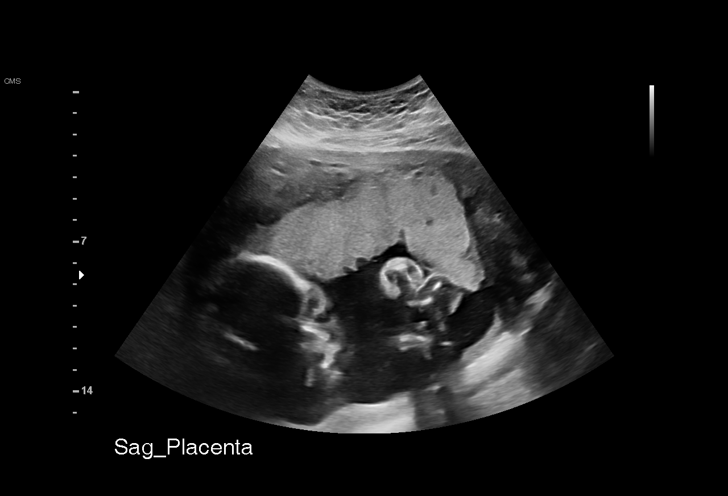
[im 8/70]
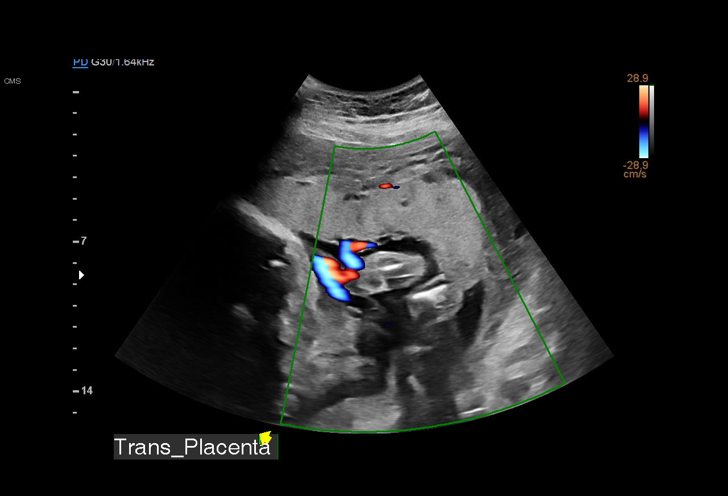
[im 13/70]
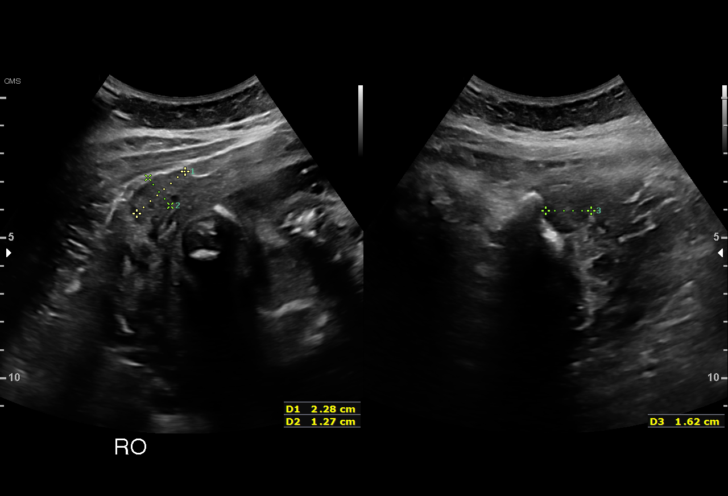
[im 18/70]
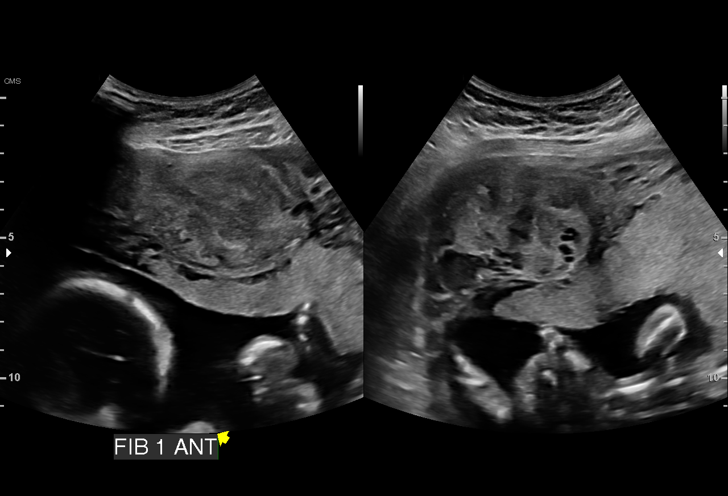
[im 24/70]
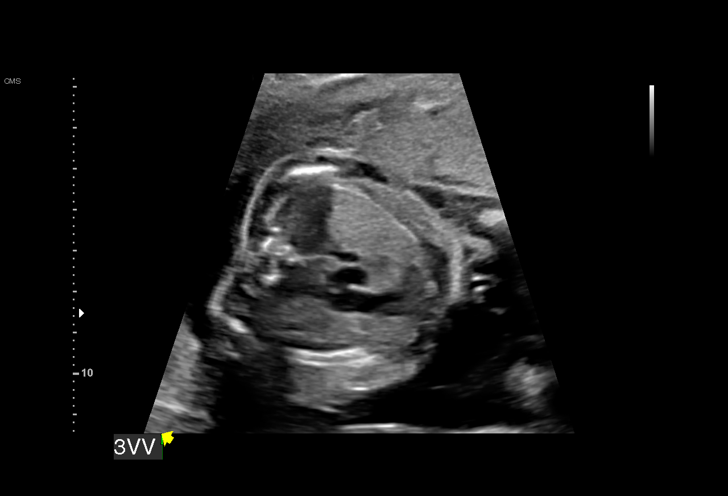
[im 29/70]
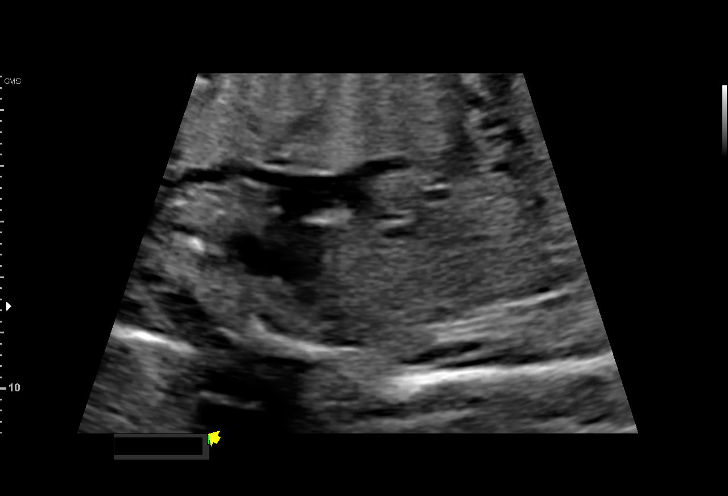
[im 36/70]
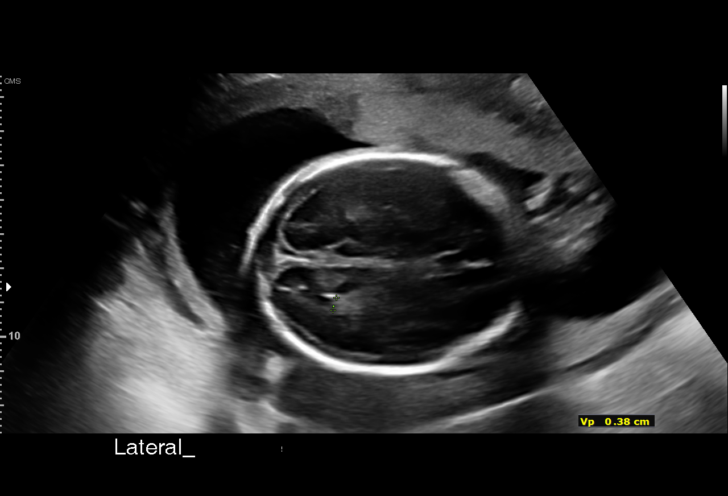
[im 41/70]
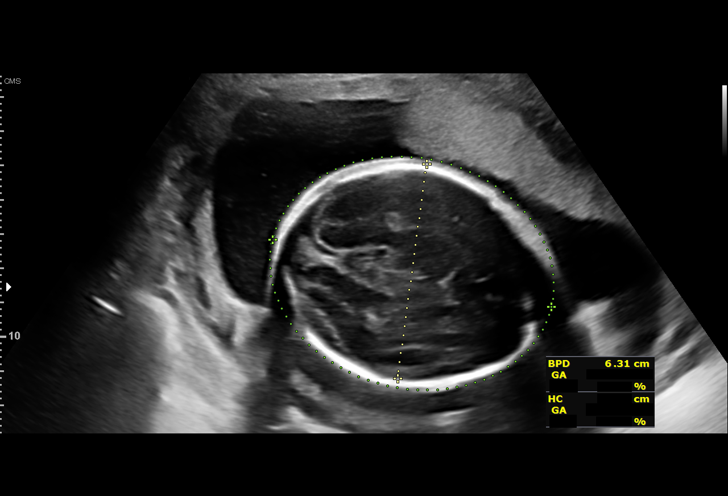
[im 47/70]
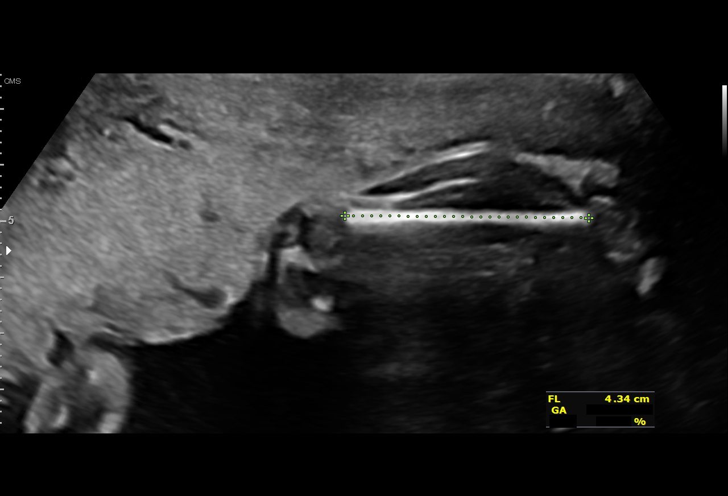
[im 52/70]
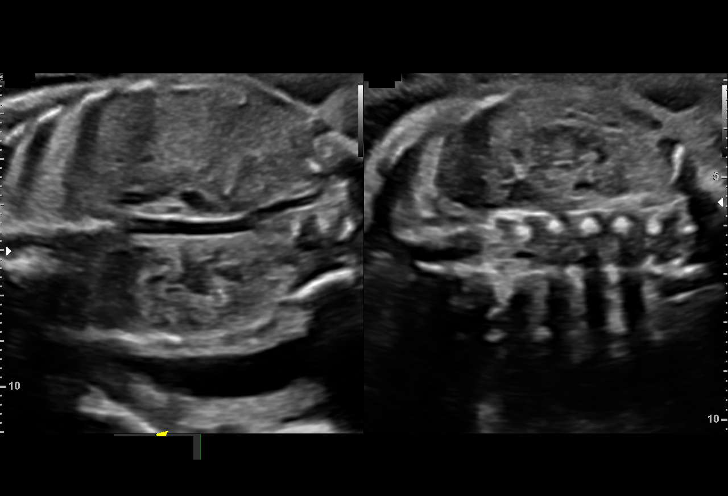
[im 57/70]
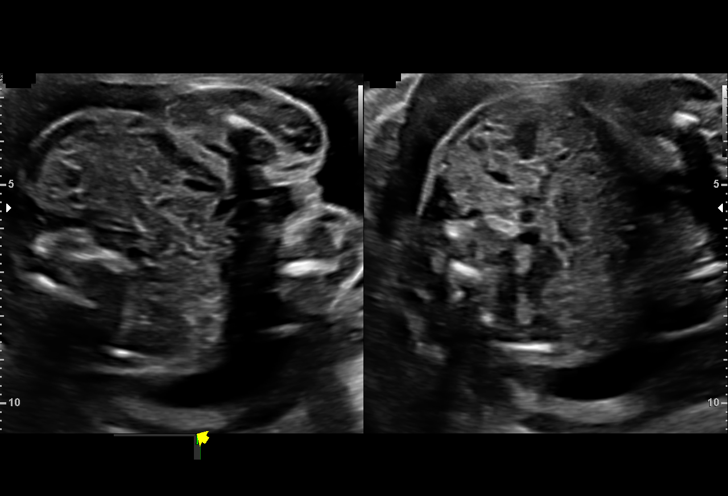
[im 62/70]
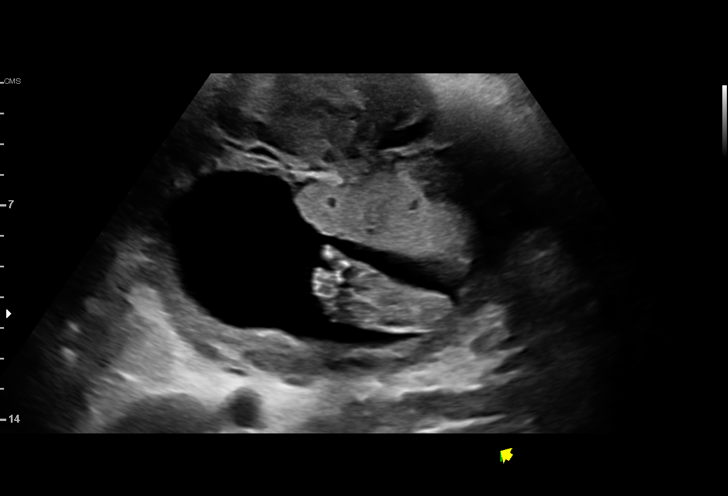
[im 67/70]
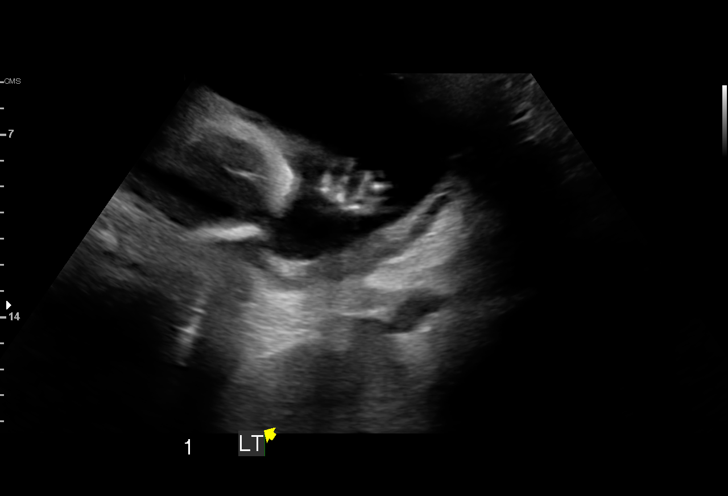

[13 of 28 positions shown; findings below may reference images not displayed]

Ref. Address:     Faculty

Indications

 24 weeks gestation of pregnancy
 Uterine fibroids affecting pregnancy in        O34.12,
 second trimester, antepartum
 Anemia during pregnancy in second trimester
 Genetic carrier (Alpha-Thalassemia)
 Low Risk NIPS(Negative AFP)
 History of sickle cell trait
 Antenatal follow-up for nonvisualized fetal
 anatomy
Fetal Evaluation

 Num Of Fetuses:         1
 Fetal Heart Rate(bpm):  162
 Cardiac Activity:       Observed
 Presentation:           Breech
 Placenta:               Anterior
 P. Cord Insertion:      Visualized

 Amniotic Fluid
 AFI FV:      Within normal limits

                             Largest Pocket(cm)
                             5
Biometry
 BPD:      62.9  mm     G. Age:  25w 3d         83  %    CI:        71.28   %    70 - 86
                                                         FL/HC:      18.2   %    18.7 -
 HC:      237.3  mm     G. Age:  25w 5d         83  %    HC/AC:      1.14        1.05 -
 AC:      208.8  mm     G. Age:  25w 3d         76  %    FL/BPD:     68.8   %    71 - 87
 FL:       43.3  mm     G. Age:  24w 1d         34  %    FL/AC:      20.7   %    20 - 24
 HUM:        42  mm     G. Age:  25w 2d         66  %
 CER:      27.7  mm     G. Age:  24w 5d         69  %

 LV:        3.8  mm
 CM:        7.1  mm

 Est. FW:     761  gm    1 lb 11 oz      75  %
OB History

 Gravidity:    1
Gestational Age

 LMP:           24w 2d        Date:  02/01/21                 EDD:   11/08/21
 U/S Today:     25w 1d                                        EDD:   11/02/21
 Best:          24w 2d     Det. By:  LMP  (02/01/21)          EDD:   11/08/21
Anatomy

 Cranium:               Appears normal         LVOT:                   Appears normal
 Cavum:                 Appears normal         Aortic Arch:            Previously seen
 Ventricles:            Appears normal         Ductal Arch:            Appears normal
 Choroid Plexus:        Appears normal         Diaphragm:              Appears normal
 Cerebellum:            Appears normal         Stomach:                Appears normal, left
                                                                       sided
 Posterior Fossa:       Appears normal         Abdomen:                Appears normal
 Nuchal Fold:           Previously seen        Abdominal Wall:         Previously seen
 Face:                  Orbits and profile     Cord Vessels:           Previously seen
                        previously seen
 Lips:                  Appears normal         Kidneys:                Appear normal
 Palate:                Not well visualized    Bladder:                Appears normal
 Thoracic:              Appears normal         Spine:                  Previously seen
 Heart:                 Appears normal         Upper Extremities:      Previously seen
                        (4CH, axis, and
                        situs)
 RVOT:                  Appears normal         Lower Extremities:      Previously seen

 Other:  Male gender previously seen. Heels/feet and LEFT open hands/5th
         digits visualized. VC, 3VV and 3VTV visualized.
Myomas

 Site                     L(cm)      W(cm)      D(cm)       Location
 Anterior Left
 Anterior
 Left LUS

 Blood Flow                  RI       PI       Comments
Impression

 Patient returned for completion of fetal anatomy .Amniotic
 fluid is normal and good fetal activity is seen .Fetal biometry
 is consistent with her previously-established dates .Fetal
 anatomical survey was completed and appears normal.
 Multiple myomas are seen again (measurements above).

 Partner is a silent carrier for alpha thalassemia and couple
 was informed of the results by our genetic counselor.
Recommendations

 -An appointment was made for her to return in 8 weeks for
 fetal growth assessment.
                 Gutierres, Rabih

## 2023-04-11 ENCOUNTER — Ambulatory Visit: Payer: Medicaid Other | Admitting: Emergency Medicine

## 2023-04-11 VITALS — BP 120/71 | HR 82 | Ht 64.0 in | Wt 194.6 lb

## 2023-04-11 DIAGNOSIS — Z3201 Encounter for pregnancy test, result positive: Secondary | ICD-10-CM

## 2023-04-11 LAB — POCT URINE PREGNANCY: Preg Test, Ur: POSITIVE — AB

## 2023-04-11 NOTE — Progress Notes (Signed)
Caitlin Gutierrez presents today for UPT. She has no unusual complaints and complains of nausea without vomiting for 7 days. LMP:     OBJECTIVE: Appears well, in no apparent distress.  OB History     Gravida  1   Para  1   Term  1   Preterm      AB      Living  1      SAB      IAB      Ectopic      Multiple  0   Live Births  1          Home UPT Result: Positive In-Office UPT result: positive I have reviewed the patient's medical, obstetrical, social, and family histories, and medications.   ASSESSMENT: Positive pregnancy test  PLAN Prenatal care to be completed at: [facility]

## 2023-05-02 ENCOUNTER — Ambulatory Visit (INDEPENDENT_AMBULATORY_CARE_PROVIDER_SITE_OTHER): Payer: Medicaid Other | Admitting: *Deleted

## 2023-05-02 ENCOUNTER — Other Ambulatory Visit (HOSPITAL_COMMUNITY)
Admission: RE | Admit: 2023-05-02 | Discharge: 2023-05-02 | Disposition: A | Discharge status: Home / Self Care | Payer: Medicaid Other | Source: Ambulatory Visit | Attending: Obstetrics and Gynecology | Admitting: Obstetrics and Gynecology

## 2023-05-02 ENCOUNTER — Other Ambulatory Visit (INDEPENDENT_AMBULATORY_CARE_PROVIDER_SITE_OTHER): Payer: Self-pay

## 2023-05-02 VITALS — BP 128/82 | HR 83 | Wt 195.0 lb

## 2023-05-02 DIAGNOSIS — O0990 Supervision of high risk pregnancy, unspecified, unspecified trimester: Secondary | ICD-10-CM | POA: Insufficient documentation

## 2023-05-02 DIAGNOSIS — Z3A10 10 weeks gestation of pregnancy: Secondary | ICD-10-CM

## 2023-05-02 DIAGNOSIS — Z1339 Encounter for screening examination for other mental health and behavioral disorders: Secondary | ICD-10-CM

## 2023-05-02 DIAGNOSIS — O3680X0 Pregnancy with inconclusive fetal viability, not applicable or unspecified: Secondary | ICD-10-CM

## 2023-05-02 DIAGNOSIS — O0991 Supervision of high risk pregnancy, unspecified, first trimester: Secondary | ICD-10-CM | POA: Diagnosis not present

## 2023-05-02 DIAGNOSIS — Z3A11 11 weeks gestation of pregnancy: Secondary | ICD-10-CM | POA: Diagnosis not present

## 2023-05-02 DIAGNOSIS — Z3481 Encounter for supervision of other normal pregnancy, first trimester: Secondary | ICD-10-CM

## 2023-05-02 NOTE — Progress Notes (Signed)
New OB Intake  I connected with Caitlin Gutierrez  on 05/02/23 at 10:15 AM EDT by In Person Visit and verified that I am speaking with the correct person using two identifiers. Nurse is located at [facility] and pt is located at [place].  I discussed the limitations, risks, security and privacy concerns of performing an evaluation and management service by telephone and the availability of in person appointments. I also discussed with the patient that there may be a patient responsible charge related to this service. The patient expressed understanding and agreed to proceed.  I explained I am completing New OB Intake today. We discussed EDD of 11/24/2023, by Last Menstrual Period. Pt is G2P1001. I reviewed her allergies, medications and Medical/Surgical/OB history.    Patient Active Problem List   Diagnosis Date Noted   H/O migraine with aura 12/31/2021   Cesarean delivery delivered 11/11/2021   Gestational hypertension 11/10/2021   Acute blood loss anemia 11/10/2021   Decreased fetal movement, third trimester, fetus 1 11/08/2021   Chlamydia infection affecting pregnancy 10/22/2021   Alpha thalassemia silent carrier 07/10/2021   Sickle cell trait (HCC) 06/17/2021   Uterine fibroids affecting pregnancy 06/17/2021   Anemia affecting pregnancy in first trimester 05/06/2021    Concerns addressed today  Delivery Plans Plans to deliver at [facility]. Discussed the nature of our practice with multiple providers including residents and students. Due to the size of the practice, the delivering provider may not be the same as those providing prenatal care.   Patient  is not a candidate for water birth. Offered upcoming OB visit with CNM to discuss further.  MyChart/Babyscripts MyChart access verified. I explained pt will have some visits in office and some virtually. Babyscripts instructions given and order placed. Patient verifies receipt of registration text/e-mail. Account successfully created and app  downloaded.  Blood Pressure Cuff/Weight Scale Pt has BP cuff at home. Explained after first prenatal appt pt will check weekly and document in Babyscripts. Patient does not have weight scale; patient may purchase if they desire to track weight weekly in Babyscripts.  Anatomy US Explained first scheduled [place] will be around 19 weeks. Anatomy US scheduled for TBD at TBD.  Interested in [place]? If yes, send referral and doula dot phrase.   Is patient a candidate for Babyscripts Optimization? Yes  First visit review I reviewed new OB appt with patient. Explained pt will be seen by Albertine Grates, NP at first visit. Discussed Avelina Laine genetic screening with patient. Requests Panorama. Routine prenatal labs  collected at today's visit.    Last Pap Diagnosis  Date Value Ref Range Status  05/04/2021   Final   - Negative for intraepithelial lesion or malignancy (NILM)    Harrel Lemon, RN 05/02/2023  10:15 AM

## 2023-05-02 NOTE — Patient Instructions (Signed)

## 2023-05-03 LAB — CBC/D/PLT+RPR+RH+ABO+RUBIGG...
Antibody Screen: NEGATIVE
Basophils Absolute: 0 10*3/uL (ref 0.0–0.2)
Basos: 0 %
EOS (ABSOLUTE): 0.2 10*3/uL (ref 0.0–0.4)
Eos: 3 %
HCV Ab: NONREACTIVE
HIV Screen 4th Generation wRfx: NONREACTIVE
Hematocrit: 33.1 % — ABNORMAL LOW (ref 34.0–46.6)
Hemoglobin: 11.2 g/dL (ref 11.1–15.9)
Hepatitis B Surface Ag: NEGATIVE
Immature Grans (Abs): 0 10*3/uL (ref 0.0–0.1)
Immature Granulocytes: 0 %
Lymphocytes Absolute: 1.7 10*3/uL (ref 0.7–3.1)
Lymphs: 30 %
MCH: 26.7 pg (ref 26.6–33.0)
MCHC: 33.8 g/dL (ref 31.5–35.7)
MCV: 79 fL (ref 79–97)
Monocytes Absolute: 0.5 10*3/uL (ref 0.1–0.9)
Monocytes: 10 %
Neutrophils Absolute: 3.2 10*3/uL (ref 1.4–7.0)
Neutrophils: 57 %
Platelets: 320 10*3/uL (ref 150–450)
RBC: 4.19 x10E6/uL (ref 3.77–5.28)
RDW: 13.3 % (ref 11.7–15.4)
RPR Ser Ql: NONREACTIVE
Rh Factor: POSITIVE
Rubella Antibodies, IGG: 2.12 {index} (ref >0.99)
WBC: 5.6 10*3/uL (ref 3.4–10.8)

## 2023-05-03 LAB — COMPREHENSIVE METABOLIC PANEL
ALT: 9 IU/L (ref 0–32)
AST: 11 IU/L (ref 0–40)
Albumin: 4.4 g/dL (ref 3.9–4.9)
Alkaline Phosphatase: 65 IU/L (ref 44–121)
BUN/Creatinine Ratio: 8 — ABNORMAL LOW (ref 9–23)
BUN: 4 mg/dL — ABNORMAL LOW (ref 6–20)
Bilirubin Total: 0.2 mg/dL (ref 0.0–1.2)
CO2: 21 mmol/L (ref 20–29)
Calcium: 9.9 mg/dL (ref 8.7–10.2)
Chloride: 101 mmol/L (ref 96–106)
Creatinine, Ser: 0.53 mg/dL — ABNORMAL LOW (ref 0.57–1.00)
Globulin, Total: 2.8 g/dL (ref 1.5–4.5)
Glucose: 82 mg/dL (ref 70–99)
Potassium: 4.2 mmol/L (ref 3.5–5.2)
Sodium: 134 mmol/L (ref 134–144)
Total Protein: 7.2 g/dL (ref 6.0–8.5)
eGFR: 123 mL/min/{1.73_m2} (ref >59)

## 2023-05-03 LAB — HEMOGLOBIN A1C
Est. average glucose Bld gHb Est-mCnc: 117 mg/dL
Hgb A1c MFr Bld: 5.7 % — ABNORMAL HIGH (ref 4.8–5.6)

## 2023-05-03 LAB — HCV INTERPRETATION

## 2023-05-03 LAB — CERVICOVAGINAL ANCILLARY ONLY
Chlamydia: NEGATIVE
Comment: NEGATIVE
Comment: NORMAL
Neisseria Gonorrhea: NEGATIVE

## 2023-05-04 ENCOUNTER — Encounter: Payer: Self-pay | Admitting: Obstetrics and Gynecology

## 2023-05-04 LAB — URINE CULTURE, OB REFLEX

## 2023-05-04 LAB — CULTURE, OB URINE

## 2023-06-15 ENCOUNTER — Ambulatory Visit: Payer: Medicaid Other | Admitting: Obstetrics and Gynecology

## 2023-06-15 VITALS — BP 124/80 | HR 90 | Wt 198.8 lb

## 2023-06-15 DIAGNOSIS — O99810 Abnormal glucose complicating pregnancy: Secondary | ICD-10-CM

## 2023-06-15 DIAGNOSIS — Z3A16 16 weeks gestation of pregnancy: Secondary | ICD-10-CM | POA: Diagnosis not present

## 2023-06-15 DIAGNOSIS — O0990 Supervision of high risk pregnancy, unspecified, unspecified trimester: Secondary | ICD-10-CM

## 2023-06-15 DIAGNOSIS — O0992 Supervision of high risk pregnancy, unspecified, second trimester: Secondary | ICD-10-CM | POA: Diagnosis not present

## 2023-06-15 DIAGNOSIS — Z8759 Personal history of other complications of pregnancy, childbirth and the puerperium: Secondary | ICD-10-CM

## 2023-06-15 DIAGNOSIS — Z98891 History of uterine scar from previous surgery: Secondary | ICD-10-CM | POA: Diagnosis not present

## 2023-06-15 DIAGNOSIS — Z23 Encounter for immunization: Secondary | ICD-10-CM | POA: Diagnosis not present

## 2023-06-15 MED ORDER — ASPIRIN 81 MG PO TBEC
81.0000 mg | DELAYED_RELEASE_TABLET | Freq: Every day | ORAL | 2 refills | Status: DC
Start: 2023-06-15 — End: 2023-11-20

## 2023-06-15 NOTE — Progress Notes (Signed)
198.8 

## 2023-06-15 NOTE — Progress Notes (Signed)
INITIAL PRENATAL VISIT  Subjective:   Caitlin Gutierrez is being seen today for her first obstetrical visit.She is at [redacted]w[redacted]d gestation by LMP.  Her obstetrical history is significant for  cesarean section . Relationship with FOB: spouse, living together. Patient does intend to breast feed. Pregnancy history fully reviewed.  Patient reports no complaints.  Indications for ASA therapy (per uptodate) One of the following: Previous pregnancy with preeclampsia, especially early onset and with an adverse outcome No Multifetal gestation No Chronic hypertension No Type 1 or 2 diabetes mellitus No Chronic kidney disease No Autoimmune disease (antiphospholipid syndrome, systemic lupus erythematosus) No  Two or more of the following: Nulliparity No Obesity (body mass index >30 kg/m2) Yes Family history of preeclampsia in mother or sister Yes Age >=35 years Yes Sociodemographic characteristics (African American race, low socioeconomic level) Yes Personal risk factors (eg, previous pregnancy with low birth weight or small for gestational age infant, previous adverse pregnancy outcome [eg, stillbirth], interval >10 years between pregnancies) No   Objective:    Obstetric History OB History  Gravida Para Term Preterm AB Living  2 1 1  0 0 1  SAB IAB Ectopic Multiple Live Births  0 0 0 0 1    # Outcome Date GA Lbr Len/2nd Weight Sex Type Anes PTL Lv  2 Current           1 Term 11/10/21 [redacted]w[redacted]d  7 lb 15.3 oz (3.61 kg) M CS-LTranv EPI  LIV    Past Medical History:  Diagnosis Date   Alpha thalassemia silent carrier 07/10/2021   Cesarean delivery delivered 11/11/2021   Gestational hypertension 11/10/2021   Hypertension    End of 1st pregnancy    Past Surgical History:  Procedure Laterality Date   CESAREAN SECTION N/A 11/10/2021   Procedure: CESAREAN SECTION;  Surgeon: Levie Heritage, DO;  Location: [facility];  Service: Obstetrics;  Laterality: N/A;    Current Outpatient Medications on  File Prior to Visit  Medication Sig Dispense Refill   Blood Pressure Monitoring (BLOOD PRESSURE KIT) DEVI 1 kit by Does not apply route as needed. 1 each 0   ferrous sulfate (FERROUSUL) 325 (65 FE) MG tablet Take 1 tablet (325 mg total) by mouth every other day. 30 tablet 3   Prenat-Fe Carbonyl-FA-Omega 3 (ONE-A-DAY WOMENS PRENATAL 1) 28-0.8-235 MG CAPS Take 1 capsule by mouth every evening.     acetaminophen (TYLENOL) 500 MG tablet Take 2 tablets (1,000 mg total) by mouth every 8 (eight) hours as needed (pain). 60 tablet 0   azithromycin (ZITHROMAX) 250 MG tablet Take 1 tablet (250 mg total) by mouth daily. Take first 2 tablets together, then 1 every day until finished. (Patient not taking: Reported on 06/15/2023) 6 tablet 0   furosemide (LASIX) 20 MG tablet Take 1 tablet (20 mg total) by mouth daily for 5 days. 5 tablet 0   NIFEdipine (ADALAT CC) 30 MG 24 hr tablet Take 1 tablet (30 mg total) by mouth daily. 30 tablet 0   oxyCODONE (OXY IR/ROXICODONE) 5 MG immediate release tablet Take 1 tablet (5 mg total) by mouth every 4 (four) hours as needed for severe pain or breakthrough pain. 20 tablet 0   No current facility-administered medications on file prior to visit.    No Known Allergies  Social History:  reports that she has never smoked. She has never used smokeless tobacco. She reports that she does not currently use alcohol. She reports that she does not use drugs.  Family History  Problem Relation Age of Onset   Hypertension Mother    Hypertension Father     The following portions of the patient's history were reviewed and updated as appropriate: allergies, current medications, past family history, past medical history, past social history, past surgical history and problem list.  Review of Systems Review of Systems  All other systems reviewed and are negative.     Physical Exam:  BP 124/80   Pulse 90   Wt 198 lb 12.8 oz (90.2 kg)   LMP 02/17/2023 (Exact Date)   BMI 34.12  kg/m  CONSTITUTIONAL: Well-developed, well-nourished female in no acute distress.  HENT:  Normocephalic, atraumatic.  EYES: Conjunctivae normal.  NECK: Normal range of motion SKIN: Skin is warm and dry.  MUSCULOSKELETAL: Normal range of motion.  NEUROLOGIC: Alert and oriented  PSYCHIATRIC: Normal mood and affect. Normal behavior. Normal judgment and thought content. CARDIOVASCULAR: Normal heart rate noted, regular rhythm RESPIRATORY:normal effort PELVIC: deferred  Fetal Heart Rate (bpm): 147   Movement: Absent       Assessment:    Pregnancy: G2P1001  1. Supervision of high risk pregnancy, antepartum BP and FHR normal Flu shot today  Initial labs drawn. Prenatal vitamins. Problem list reviewed and updated. Reviewed in detail the nature of the practice with collaborative care between  Genetic screening discussed: NIPS/First trimester screen/Quad/AFP results reviewed. Role of ultrasound in pregnancy discussed; Anatomy US: ordered.  Follow up in 4 weeks. Discussed clinic routines, schedule of care and testing, genetic screening options, involvement of students and residents under the direct supervision of APPs and doctors and presence of female providers. Pt verbalized understanding.  2. History of cesarean delivery Reports having a swollen cervix, unsure if wants tolac or RCS  3. History of gestational hypertension Discussed recommendation for ASA during pregnancy Start ASA, rx sent  - aspirin EC 81 MG tablet; Take 1 tablet (81 mg total) by mouth daily. Start taking when you are [redacted] weeks pregnant for rest of pregnancy for prevention of preeclampsia  Dispense: 300 tablet; Refill: 2  4. 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 [redacted] weeks gestation of pregnancy  Return in 1 week for early GTT, and 4 weeks for routine prenatal   Future Appointments  Date Time Provider Department Center  06/30/2023 12:15 PM [facility] NURSE [facility]  06/30/2023  12:30 PM [facility] US4 [facility]    Sue Lush, FNP

## 2023-06-15 NOTE — Addendum Note (Signed)
Addended by: Jearld Adjutant on: 06/15/2023 02:21 PM   Modules accepted: Orders

## 2023-06-21 ENCOUNTER — Inpatient Hospital Stay (HOSPITAL_COMMUNITY)
Admission: AD | Admit: 2023-06-21 | Discharge: 2023-06-22 | Disposition: A | Discharge status: Home / Self Care | Payer: Medicaid Other | Attending: Obstetrics and Gynecology | Admitting: Obstetrics and Gynecology

## 2023-06-21 DIAGNOSIS — K5900 Constipation, unspecified: Secondary | ICD-10-CM | POA: Diagnosis not present

## 2023-06-21 DIAGNOSIS — R109 Unspecified abdominal pain: Secondary | ICD-10-CM | POA: Diagnosis present

## 2023-06-21 DIAGNOSIS — O99612 Diseases of the digestive system complicating pregnancy, second trimester: Secondary | ICD-10-CM | POA: Diagnosis not present

## 2023-06-21 DIAGNOSIS — R141 Gas pain: Secondary | ICD-10-CM | POA: Diagnosis not present

## 2023-06-21 DIAGNOSIS — O26892 Other specified pregnancy related conditions, second trimester: Secondary | ICD-10-CM | POA: Insufficient documentation

## 2023-06-21 DIAGNOSIS — Z3A17 17 weeks gestation of pregnancy: Secondary | ICD-10-CM | POA: Diagnosis not present

## 2023-06-21 LAB — CBC WITH DIFFERENTIAL/PLATELET
Abs Immature Granulocytes: 0.03 10*3/uL (ref 0.00–0.07)
Basophils Absolute: 0 10*3/uL (ref 0.0–0.1)
Basophils Relative: 0 %
Eosinophils Absolute: 0.1 10*3/uL (ref 0.0–0.5)
Eosinophils Relative: 1 %
HCT: 31.1 % — ABNORMAL LOW (ref 36.0–46.0)
Hemoglobin: 10.7 g/dL — ABNORMAL LOW (ref 12.0–15.0)
Immature Granulocytes: 0 %
Lymphocytes Relative: 16 %
Lymphs Abs: 1.5 10*3/uL (ref 0.7–4.0)
MCH: 27 pg (ref 26.0–34.0)
MCHC: 34.4 g/dL (ref 30.0–36.0)
MCV: 78.3 fL — ABNORMAL LOW (ref 80.0–100.0)
Monocytes Absolute: 0.7 10*3/uL (ref 0.1–1.0)
Monocytes Relative: 8 %
Neutro Abs: 7.1 10*3/uL (ref 1.7–7.7)
Neutrophils Relative %: 75 %
Platelets: 258 10*3/uL (ref 150–400)
RBC: 3.97 MIL/uL (ref 3.87–5.11)
RDW: 13 % (ref 11.5–15.5)
WBC: 9.6 10*3/uL (ref 4.0–10.5)
nRBC: 0 % (ref 0.0–0.2)

## 2023-06-21 LAB — URINALYSIS, ROUTINE W REFLEX MICROSCOPIC
Bilirubin Urine: NEGATIVE
Glucose, UA: NEGATIVE mg/dL
Hgb urine dipstick: NEGATIVE
Ketones, ur: NEGATIVE mg/dL
Leukocytes,Ua: NEGATIVE
Nitrite: NEGATIVE
Protein, ur: NEGATIVE mg/dL
Specific Gravity, Urine: 1.006 (ref 1.005–1.030)
pH: 7 (ref 5.0–8.0)

## 2023-06-21 LAB — COMPREHENSIVE METABOLIC PANEL
ALT: 11 U/L (ref 0–44)
AST: 14 U/L — ABNORMAL LOW (ref 15–41)
Albumin: 3.6 g/dL (ref 3.5–5.0)
Alkaline Phosphatase: 51 U/L (ref 38–126)
Anion gap: 9 (ref 5–15)
BUN: <5 mg/dL — ABNORMAL LOW (ref 6–20)
CO2: 21 mmol/L — ABNORMAL LOW (ref 22–32)
Calcium: 9.4 mg/dL (ref 8.9–10.3)
Chloride: 105 mmol/L (ref 98–111)
Creatinine, Ser: 0.47 mg/dL (ref 0.44–1.00)
GFR, Estimated: >60 mL/min (ref >60)
Glucose, Bld: 87 mg/dL (ref 70–99)
Potassium: 3.8 mmol/L (ref 3.5–5.1)
Sodium: 135 mmol/L (ref 135–145)
Total Bilirubin: 0.5 mg/dL (ref 0.3–1.2)
Total Protein: 7.2 g/dL (ref 6.5–8.1)

## 2023-06-21 LAB — WET PREP, GENITAL
Clue Cells Wet Prep HPF POC: NONE SEEN
Sperm: NONE SEEN
Trich, Wet Prep: NONE SEEN
WBC, Wet Prep HPF POC: <10 (ref <10)
Yeast Wet Prep HPF POC: NONE SEEN

## 2023-06-21 LAB — PROTEIN / CREATININE RATIO, URINE
Creatinine, Urine: 50 mg/dL
Total Protein, Urine: <6 mg/dL

## 2023-06-21 LAB — AMYLASE: Amylase: 103 U/L — ABNORMAL HIGH (ref 28–100)

## 2023-06-21 LAB — LIPASE, BLOOD: Lipase: 22 U/L (ref 11–51)

## 2023-06-21 MED ORDER — HYOSCYAMINE SULFATE 0.125 MG SL SUBL
0.1250 mg | SUBLINGUAL_TABLET | Freq: Once | SUBLINGUAL | Status: AC
  Administered 2023-06-21: 0.125 mg via SUBLINGUAL
  Filled 2023-06-21: qty 1

## 2023-06-21 MED ORDER — SIMETHICONE 80 MG PO CHEW
80.0000 mg | CHEWABLE_TABLET | Freq: Once | ORAL | Status: AC
  Administered 2023-06-21: 80 mg via ORAL
  Filled 2023-06-21: qty 1

## 2023-06-21 NOTE — MAU Provider Note (Signed)
Chief Complaint:  Abdominal Pain   Event Date/Time   First Provider Initiated Contact with Patient 06/21/23 2157     HPI: Caitlin Gutierrez is a 37 y.o. G2P1001 at 80w5dwho presents to maternity admissions reporting abdominal pain all day   Did not take anything for it  Pain is diffuse, moves from one side to the other randomly . She reports good fetal movement, denies LOF, vaginal bleeding, vaginal itching/burning, urinary symptoms, h/a, dizziness, n/v, diarrhea, constipation or fever/chills.    Abdominal Pain This is a new problem. The current episode started today. The problem has been unchanged. The pain is located in the generalized abdominal region. The quality of the pain is cramping and colicky. The abdominal pain does not radiate. Associated symptoms include constipation. Pertinent negatives include no diarrhea, dysuria, nausea or vomiting. The pain is aggravated by movement and palpation. The pain is relieved by Nothing. She has tried nothing for the symptoms.   RN Note: Caitlin Gutierrez is a 37 y.o. at [redacted]w[redacted]d here in MAU reporting: stomach really hurts,states pain is constant.  Woke her at 0600, getting worse. No bleeding or d/c.  Little constipated.  Onset of complaint: 0600 Pain score: 8  Past Medical History: Past Medical History:  Diagnosis Date   Alpha thalassemia silent carrier 07/10/2021   Cesarean delivery delivered 11/11/2021   Gestational hypertension 11/10/2021   Hypertension    End of 1st pregnancy    Past obstetric history: OB History  Gravida Para Term Preterm AB Living  2 1 1  0 0 1  SAB IAB Ectopic Multiple Live Births  0 0 0 0 1    # Outcome Date GA Lbr Len/2nd Weight Sex Type Anes PTL Lv  2 Current           1 Term 11/10/21 [redacted]w[redacted]d  3610 g M CS-LTranv EPI  LIV    Past Surgical History: Past Surgical History:  Procedure Laterality Date   CESAREAN SECTION N/A 11/10/2021   Procedure: CESAREAN SECTION;  Surgeon: Levie Heritage, DO;  Location: [facility] LD [facility]S;   Service: Obstetrics;  Laterality: N/A;    Family History: Family History  Problem Relation Age of Onset   Hypertension Mother    Hypertension Father     Social History: Social History   Tobacco Use   Smoking status: Never   Smokeless tobacco: Never  Vaping Use   Vaping status: Never Used  Substance Use Topics   Alcohol use: Not Currently   Drug use: Never    Allergies: No Known Allergies  Meds:  Medications Prior to Admission  Medication Sig Dispense Refill Last Dose   aspirin EC 81 MG tablet Take 1 tablet (81 mg total) by mouth daily. Start taking when you are [redacted] weeks pregnant for rest of pregnancy for prevention of preeclampsia 300 tablet 2    azithromycin (ZITHROMAX) 250 MG tablet Take 1 tablet (250 mg total) by mouth daily. Take first 2 tablets together, then 1 every day until finished. (Patient not taking: Reported on 06/15/2023) 6 tablet 0    Blood Pressure Monitoring (BLOOD PRESSURE KIT) DEVI 1 kit by Does not apply route as needed. 1 each 0    ferrous sulfate (FERROUSUL) 325 (65 FE) MG tablet Take 1 tablet (325 mg total) by mouth every other day. 30 tablet 3    Prenat-Fe Carbonyl-FA-Omega 3 (ONE-A-DAY WOMENS PRENATAL 1) 28-0.8-235 MG CAPS Take 1 capsule by mouth every evening.       I have reviewed patient's Past Medical  Hx, Surgical Hx, Family Hx, Social Hx, medications and allergies.   ROS:  Review of Systems  Gastrointestinal:  Positive for abdominal pain and constipation. Negative for diarrhea, nausea and vomiting.  Genitourinary:  Negative for dysuria.   Other systems negative  Physical Exam  Patient Vitals for the past 24 hrs:  BP Temp Temp src Pulse Resp SpO2 Height Weight  06/21/23 1526 (!) 142/75 98.6 F (37 C) Oral 91 20 100 % 5\' 4"  (1.626 m) 90.6 kg   Constitutional: Well-developed, well-nourished female in no acute distress.  Cardiovascular: normal rate and rhythm Respiratory: normal effort, clear to auscultation bilaterally GI: Abd soft,  diffusely tender, bloated, c/w gas distension.  Uterus is gravid appropriate for gestational age.   No rebound or guarding. MS: Extremities nontender, no edema, normal ROM Neurologic: Alert and oriented x 4.  GU: Neg CVAT.  PELVIC EXAM: Dilation: Closed Effacement (%): Thick Cervical Position: Posterior Exam by:: Mayford Knife, CNM  FHT:   148 Labs: Results for orders placed or performed during the hospital encounter of 06/21/23 (from the past 24 hour(s))  Urinalysis, Routine w reflex microscopic -Urine, Clean Catch     Status: Abnormal   Collection Time: 06/21/23  3:53 PM  Result Value Ref Range   Color, Urine YELLOW YELLOW   APPearance HAZY (A) CLEAR   Specific Gravity, Urine 1.006 1.005 - 1.030   pH 7.0 5.0 - 8.0   Glucose, UA NEGATIVE NEGATIVE mg/dL   Hgb urine dipstick NEGATIVE NEGATIVE   Bilirubin Urine NEGATIVE NEGATIVE   Ketones, ur NEGATIVE NEGATIVE mg/dL   Protein, ur NEGATIVE NEGATIVE mg/dL   Nitrite NEGATIVE NEGATIVE   Leukocytes,Ua NEGATIVE NEGATIVE  Protein / creatinine ratio, urine     Status: None   Collection Time: 06/21/23  3:53 PM  Result Value Ref Range   Creatinine, Urine 50 mg/dL   Total Protein, Urine <6 mg/dL   Protein Creatinine Ratio        0.00 - 0.15 mg/mg[Cre]  CBC with Differential/Platelet     Status: Abnormal   Collection Time: 06/21/23  4:38 PM  Result Value Ref Range   WBC 9.6 4.0 - 10.5 K/uL   RBC 3.97 3.87 - 5.11 MIL/uL   Hemoglobin 10.7 (L) 12.0 - 15.0 g/dL   HCT 02.7 (L) 25.3 - 66.4 %   MCV 78.3 (L) 80.0 - 100.0 fL   MCH 27.0 26.0 - 34.0 pg   MCHC 34.4 30.0 - 36.0 g/dL   RDW 40.3 47.4 - 25.9 %   Platelets 258 150 - 400 K/uL   nRBC 0.0 0.0 - 0.2 %   Neutrophils Relative % 75 %   Neutro Abs 7.1 1.7 - 7.7 K/uL   Lymphocytes Relative 16 %   Lymphs Abs 1.5 0.7 - 4.0 K/uL   Monocytes Relative 8 %   Monocytes Absolute 0.7 0.1 - 1.0 K/uL   Eosinophils Relative 1 %   Eosinophils Absolute 0.1 0.0 - 0.5 K/uL   Basophils Relative 0 %    Basophils Absolute 0.0 0.0 - 0.1 K/uL   Immature Granulocytes 0 %   Abs Immature Granulocytes 0.03 0.00 - 0.07 K/uL  Comprehensive metabolic panel     Status: Abnormal   Collection Time: 06/21/23  4:38 PM  Result Value Ref Range   Sodium 135 135 - 145 mmol/L   Potassium 3.8 3.5 - 5.1 mmol/L   Chloride 105 98 - 111 mmol/L   CO2 21 (L) 22 - 32 mmol/L   Glucose, Bld 87  70 - 99 mg/dL   BUN <5 (L) 6 - 20 mg/dL   Creatinine, Ser 4.01 0.44 - 1.00 mg/dL   Calcium 9.4 8.9 - 02.7 mg/dL   Total Protein 7.2 6.5 - 8.1 g/dL   Albumin 3.6 3.5 - 5.0 g/dL   AST 14 (L) 15 - 41 U/L   ALT 11 0 - 44 U/L   Alkaline Phosphatase 51 38 - 126 U/L   Total Bilirubin 0.5 0.3 - 1.2 mg/dL   GFR, Estimated >25 >36 mL/min   Anion gap 9 5 - 15  Amylase     Status: Abnormal   Collection Time: 06/21/23  4:38 PM  Result Value Ref Range   Amylase 103 (H) 28 - 100 U/L  Lipase, blood     Status: None   Collection Time: 06/21/23  4:38 PM  Result Value Ref Range   Lipase 22 11 - 51 U/L  Wet prep, genital     Status: None   Collection Time: 06/21/23  5:40 PM  Result Value Ref Range   Yeast Wet Prep HPF POC NONE SEEN NONE SEEN   Trich, Wet Prep NONE SEEN NONE SEEN   Clue Cells Wet Prep HPF POC NONE SEEN NONE SEEN   WBC, Wet Prep HPF POC <10 <10   Sperm NONE SEEN    O/Positive/-- (09/09 1115)  Imaging:  No results found.  MAU Course/MDM: I have reviewed the triage vital signs and the nursing notes.   Pertinent labs & imaging results that were available during my care of the patient were reviewed by me and considered in my medical decision making (see chart for details).      I have reviewed her medical records including past results, notes and treatments.   I have ordered labs and reviewed results. Office CNM ordered Preeclampsia labs, normal  No leukocytosis  Treatments in MAU included simethicone and Levsin which did improve pain. .    Assessment: Single IUP at [redacted]w[redacted]d Abdominal pain and  bloating Constipation  Plan: Discharge home Rx Miralax for constipation Rx Mylicon for gas pains Labor precautions and fetal kick counts Follow up in Office for prenatal visits and recheck Encouraged to return if she develops worsening of symptoms, increase in pain, fever, or other concerning symptoms.   Pt stable at time of discharge.  Wynelle Bourgeois CNM, MSN Certified Nurse-Midwife 06/21/2023 9:57 PM

## 2023-06-21 NOTE — MAU Note (Signed)
Caitlin Gutierrez is a 37 y.o. at [redacted]w[redacted]d here in MAU reporting: stomach really hurts,states pain is constant.  Woke her at 0600, getting worse. No bleeding or d/c.  Little constipated.  Onset of complaint: 0600 Pain score: 8 Vitals:   06/21/23 1526  BP: (!) 142/75  Pulse: 91  Resp: 20  Temp: 98.6 F (37 C)  SpO2: 100%     FHT:148 Lab orders placed from triage:  urine

## 2023-06-22 DIAGNOSIS — R141 Gas pain: Secondary | ICD-10-CM

## 2023-06-22 DIAGNOSIS — K5900 Constipation, unspecified: Secondary | ICD-10-CM

## 2023-06-22 DIAGNOSIS — Z3A17 17 weeks gestation of pregnancy: Secondary | ICD-10-CM

## 2023-06-22 DIAGNOSIS — O26892 Other specified pregnancy related conditions, second trimester: Secondary | ICD-10-CM

## 2023-06-22 LAB — GC/CHLAMYDIA PROBE AMP (~~LOC~~) NOT AT ARMC
Chlamydia: NEGATIVE
Comment: NEGATIVE
Comment: NORMAL
Neisseria Gonorrhea: NEGATIVE

## 2023-06-22 MED ORDER — SIMETHICONE 80 MG PO CHEW: 80.0000 mg | CHEWABLE_TABLET | Freq: Four times a day (QID) | ORAL | 0 refills | Status: DC | PRN

## 2023-06-22 MED ORDER — POLYETHYLENE GLYCOL 3350 17 GM/SCOOP PO POWD: 1.0000 | Freq: Once | ORAL | 0 refills | Status: AC

## 2023-06-23 ENCOUNTER — Ambulatory Visit (INDEPENDENT_AMBULATORY_CARE_PROVIDER_SITE_OTHER): Payer: Medicaid Other | Admitting: Obstetrics and Gynecology

## 2023-06-23 ENCOUNTER — Other Ambulatory Visit: Payer: Medicaid Other

## 2023-06-23 VITALS — BP 124/72 | HR 89 | Wt 195.4 lb

## 2023-06-23 DIAGNOSIS — Z8759 Personal history of other complications of pregnancy, childbirth and the puerperium: Secondary | ICD-10-CM

## 2023-06-23 DIAGNOSIS — Z3A18 18 weeks gestation of pregnancy: Secondary | ICD-10-CM

## 2023-06-23 DIAGNOSIS — O99810 Abnormal glucose complicating pregnancy: Secondary | ICD-10-CM

## 2023-06-23 DIAGNOSIS — Z98891 History of uterine scar from previous surgery: Secondary | ICD-10-CM

## 2023-06-23 DIAGNOSIS — O0990 Supervision of high risk pregnancy, unspecified, unspecified trimester: Secondary | ICD-10-CM

## 2023-06-23 NOTE — Progress Notes (Signed)
  HIGH-RISK PREGNANCY OFFICE VISIT Patient name: Caitlin Gutierrez MRN [id number]  Date of birth: 15-Jan-1986 Chief Complaint:   No chief complaint on file.  History of Present Illness:   Caitlin Gutierrez is a 37 y.o. 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 Contractions: Not present. Vag. Bleeding: None.  Movement: Absent. denies leaking of fluid.  Review of Systems:   Pertinent items are noted in HPI Denies abnormal vaginal discharge w/ itching/odor/irritation, headaches, visual changes, shortness of breath, chest pain, abdominal pain, severe nausea/vomiting, or problems with urination or bowel movements unless otherwise stated above. Pertinent History Reviewed:  Reviewed past medical,surgical, social, obstetrical and family history.  Reviewed problem list, medications and allergies. Physical Assessment:   Vitals:   06/23/23 1307  BP: 124/72  Pulse: 89  Weight: 195 lb 6.4 oz (88.6 kg)  Body mass index is 33.54 kg/m.           Physical Examination:   General appearance: alert, well appearing, and in no distress, oriented to person, place, and time, and normal appearing weight  Mental status: alert, oriented to person, place, and time, normal mood, behavior, speech, dress, motor activity, and thought processes  Skin: warm & dry   Extremities: Edema: None    Cardiovascular: normal heart rate noted  Respiratory: normal respiratory effort, no distress  Abdomen: gravid, soft, non-tender  Pelvic: Cervical exam deferred         Fetal Status: Fetal Heart Rate (bpm): 145   Movement: Absent    Fetal Surveillance Testing today: none   No results found for this or any previous visit (from the past 24 hour(s)).  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   Labs/procedures today: 2 hr GTT  Treatment Plan:    Reviewed: Preterm labor symptoms and general obstetric precautions including but not limited to vaginal bleeding, contractions, leaking of fluid and fetal movement were reviewed in detail with the patient.  All questions were answered. Has home bp cuff. Check bp weekly, let us know if >140/90.   Follow-up: Return in about 4 weeks (around 07/21/2023) for Return OB visit.  Orders Placed This Encounter  Procedures   Glucose Tolerance, 2 Hours w/1 Hour   Raelyn Mora MSN, CNM 06/23/2023 10:50 AM

## 2023-06-24 LAB — GLUCOSE TOLERANCE, 2 HOURS W/ 1HR
Glucose, 1 hour: 171 mg/dL (ref 70–179)
Glucose, 2 hour: 133 mg/dL (ref 70–152)
Glucose, Fasting: 76 mg/dL (ref 70–91)

## 2023-06-28 DIAGNOSIS — O09529 Supervision of elderly multigravida, unspecified trimester: Secondary | ICD-10-CM | POA: Insufficient documentation

## 2023-06-28 DIAGNOSIS — O99210 Obesity complicating pregnancy, unspecified trimester: Secondary | ICD-10-CM | POA: Insufficient documentation

## 2023-06-30 ENCOUNTER — Ambulatory Visit: Payer: Medicaid Other

## 2023-06-30 ENCOUNTER — Other Ambulatory Visit: Payer: Self-pay

## 2023-06-30 ENCOUNTER — Ambulatory Visit: Payer: Medicaid Other | Attending: Obstetrics and Gynecology

## 2023-06-30 ENCOUNTER — Other Ambulatory Visit: Payer: Self-pay | Admitting: *Deleted

## 2023-06-30 DIAGNOSIS — O99012 Anemia complicating pregnancy, second trimester: Secondary | ICD-10-CM | POA: Diagnosis not present

## 2023-06-30 DIAGNOSIS — D259 Leiomyoma of uterus, unspecified: Secondary | ICD-10-CM

## 2023-06-30 DIAGNOSIS — O09522 Supervision of elderly multigravida, second trimester: Secondary | ICD-10-CM

## 2023-06-30 DIAGNOSIS — O0990 Supervision of high risk pregnancy, unspecified, unspecified trimester: Secondary | ICD-10-CM | POA: Diagnosis present

## 2023-06-30 DIAGNOSIS — D5740 Sickle-cell thalassemia without crisis: Secondary | ICD-10-CM

## 2023-06-30 DIAGNOSIS — O3412 Maternal care for benign tumor of corpus uteri, second trimester: Secondary | ICD-10-CM | POA: Diagnosis not present

## 2023-06-30 DIAGNOSIS — Z362 Encounter for other antenatal screening follow-up: Secondary | ICD-10-CM

## 2023-06-30 DIAGNOSIS — O34219 Maternal care for unspecified type scar from previous cesarean delivery: Secondary | ICD-10-CM

## 2023-06-30 DIAGNOSIS — Z3A19 19 weeks gestation of pregnancy: Secondary | ICD-10-CM

## 2023-06-30 DIAGNOSIS — O09529 Supervision of elderly multigravida, unspecified trimester: Secondary | ICD-10-CM

## 2023-06-30 DIAGNOSIS — O99210 Obesity complicating pregnancy, unspecified trimester: Secondary | ICD-10-CM

## 2023-07-13 ENCOUNTER — Ambulatory Visit (INDEPENDENT_AMBULATORY_CARE_PROVIDER_SITE_OTHER): Payer: Medicaid Other | Admitting: Obstetrics and Gynecology

## 2023-07-13 ENCOUNTER — Encounter: Payer: Self-pay | Admitting: Obstetrics and Gynecology

## 2023-07-13 VITALS — BP 134/77 | HR 102 | Wt 200.6 lb

## 2023-07-13 DIAGNOSIS — O3412 Maternal care for benign tumor of corpus uteri, second trimester: Secondary | ICD-10-CM

## 2023-07-13 DIAGNOSIS — O99210 Obesity complicating pregnancy, unspecified trimester: Secondary | ICD-10-CM

## 2023-07-13 DIAGNOSIS — O0990 Supervision of high risk pregnancy, unspecified, unspecified trimester: Secondary | ICD-10-CM

## 2023-07-13 DIAGNOSIS — D259 Leiomyoma of uterus, unspecified: Secondary | ICD-10-CM

## 2023-07-13 DIAGNOSIS — Z3A20 20 weeks gestation of pregnancy: Secondary | ICD-10-CM

## 2023-07-13 DIAGNOSIS — O09529 Supervision of elderly multigravida, unspecified trimester: Secondary | ICD-10-CM

## 2023-07-13 DIAGNOSIS — O165 Unspecified maternal hypertension, complicating the puerperium: Secondary | ICD-10-CM | POA: Insufficient documentation

## 2023-07-13 DIAGNOSIS — Z8759 Personal history of other complications of pregnancy, childbirth and the puerperium: Secondary | ICD-10-CM | POA: Insufficient documentation

## 2023-07-13 DIAGNOSIS — Z98891 History of uterine scar from previous surgery: Secondary | ICD-10-CM | POA: Insufficient documentation

## 2023-07-13 NOTE — Progress Notes (Signed)
AF

## 2023-07-13 NOTE — Progress Notes (Signed)
   PRENATAL VISIT NOTE  Subjective:  Caitlin Gutierrez is a 37 y.o. G2P1001 at [redacted]w[redacted]d being seen today for ongoing prenatal care.  She is currently monitored for the following issues for this high-risk pregnancy and has Sickle cell trait (HCC); Uterine fibroids affecting pregnancy; Alpha thalassemia silent carrier; H/O migraine with aura; Supervision of high risk pregnancy, antepartum; AMA (advanced maternal age) multigravida 35+, unspecified trimester; Obesity affecting pregnancy; History of gestational hypertension; and H/O: C-section on their problem list.  Patient reports  some sharp pains on either side of uterus .  Contractions: Not present.  .  Movement: Present. Denies leaking of fluid.   The following portions of the patient's history were reviewed and updated as appropriate: allergies, current medications, past family history, past medical history, past social history, past surgical history and problem list.   Objective:   Vitals:   07/13/23 1604  BP: 134/77  Pulse: (!) 102  Weight: 200 lb 9.6 oz (91 kg)    Fetal Status: Fetal Heart Rate (bpm): 154   Movement: Present     General:  Alert, oriented and cooperative. Patient is in no acute distress.  Skin: Skin is warm and dry. No rash noted.   Cardiovascular: Normal heart rate noted  Respiratory: Normal respiratory effort, no problems with respiration noted  Abdomen: Soft, gravid, appropriate for gestational age.  Pain/Pressure: Absent     Pelvic: Cervical exam deferred        Extremities: Normal range of motion.  Edema: None  Mental Status: Normal mood and affect. Normal behavior. Normal judgment and thought content.   Assessment and Plan:  Pregnancy: G2P1001 at [redacted]w[redacted]d  1. Supervision of high risk pregnancy, antepartum - AFP, Serum, Open Spina Bifida  2. AMA (advanced maternal age) multigravida 35+, unspecified trimester  3. Obesity affecting pregnancy, antepartum, unspecified obesity type  4. History of gestational  hypertension  5. H/O: C-section  6. Leiomyoma of uterus affecting pregnancy in second trimester  7. [redacted] weeks gestation of pregnancy   Preterm labor symptoms and general obstetric precautions including but not limited to vaginal bleeding, contractions, leaking of fluid and fetal movement were reviewed in detail with the patient. Please refer to After Visit Summary for other counseling recommendations.   Return in about 4 weeks (around 08/10/2023) for high OB.  Future Appointments  Date Time Provider Department Center  08/04/2023  2:30 PM [facility]  08/10/2023  4:10 PM Constant, Gigi Gin, MD [facility] None    Conan Bowens, MD

## 2023-07-16 LAB — AFP, SERUM, OPEN SPINA BIFIDA
AFP MoM: 0.93
AFP Value: 47.6 ng/mL
Gest. Age on Collection Date: 20 wk
Maternal Age At EDD: 36.8 a
OSBR Risk 1 IN: 10000
Test Results:: NEGATIVE
Weight: 200 [lb_av]

## 2023-07-28 ENCOUNTER — Encounter: Payer: Self-pay | Admitting: *Deleted

## 2023-08-04 ENCOUNTER — Other Ambulatory Visit: Payer: Self-pay

## 2023-08-04 ENCOUNTER — Ambulatory Visit: Payer: Medicaid Other | Attending: Obstetrics and Gynecology

## 2023-08-04 ENCOUNTER — Ambulatory Visit: Payer: Medicaid Other

## 2023-08-04 DIAGNOSIS — O3412 Maternal care for benign tumor of corpus uteri, second trimester: Secondary | ICD-10-CM

## 2023-08-04 DIAGNOSIS — Z362 Encounter for other antenatal screening follow-up: Secondary | ICD-10-CM | POA: Insufficient documentation

## 2023-08-04 DIAGNOSIS — D5740 Sickle-cell thalassemia without crisis: Secondary | ICD-10-CM

## 2023-08-04 DIAGNOSIS — O99012 Anemia complicating pregnancy, second trimester: Secondary | ICD-10-CM | POA: Diagnosis not present

## 2023-08-04 DIAGNOSIS — O34219 Maternal care for unspecified type scar from previous cesarean delivery: Secondary | ICD-10-CM

## 2023-08-04 DIAGNOSIS — Z3A24 24 weeks gestation of pregnancy: Secondary | ICD-10-CM

## 2023-08-04 DIAGNOSIS — O09522 Supervision of elderly multigravida, second trimester: Secondary | ICD-10-CM

## 2023-08-04 DIAGNOSIS — D259 Leiomyoma of uterus, unspecified: Secondary | ICD-10-CM

## 2023-08-05 ENCOUNTER — Other Ambulatory Visit: Payer: Self-pay | Admitting: *Deleted

## 2023-08-05 DIAGNOSIS — D563 Thalassemia minor: Secondary | ICD-10-CM

## 2023-08-05 DIAGNOSIS — O09522 Supervision of elderly multigravida, second trimester: Secondary | ICD-10-CM

## 2023-08-05 DIAGNOSIS — D259 Leiomyoma of uterus, unspecified: Secondary | ICD-10-CM

## 2023-08-05 DIAGNOSIS — O3412 Maternal care for benign tumor of corpus uteri, second trimester: Secondary | ICD-10-CM

## 2023-08-05 DIAGNOSIS — Z8759 Personal history of other complications of pregnancy, childbirth and the puerperium: Secondary | ICD-10-CM

## 2023-08-05 DIAGNOSIS — O34219 Maternal care for unspecified type scar from previous cesarean delivery: Secondary | ICD-10-CM

## 2023-08-05 DIAGNOSIS — D573 Sickle-cell trait: Secondary | ICD-10-CM

## 2023-08-05 DIAGNOSIS — O99212 Obesity complicating pregnancy, second trimester: Secondary | ICD-10-CM

## 2023-08-08 ENCOUNTER — Encounter: Payer: Medicaid Other | Admitting: Obstetrics and Gynecology

## 2023-08-10 ENCOUNTER — Encounter: Payer: Medicaid Other | Admitting: Obstetrics and Gynecology

## 2023-08-25 ENCOUNTER — Ambulatory Visit: Payer: Medicaid Other | Admitting: Obstetrics & Gynecology

## 2023-08-25 ENCOUNTER — Encounter: Payer: Self-pay | Admitting: Obstetrics & Gynecology

## 2023-08-25 VITALS — BP 128/75 | HR 101 | Wt 204.0 lb

## 2023-08-25 DIAGNOSIS — O0990 Supervision of high risk pregnancy, unspecified, unspecified trimester: Secondary | ICD-10-CM

## 2023-08-25 DIAGNOSIS — D573 Sickle-cell trait: Secondary | ICD-10-CM

## 2023-08-25 DIAGNOSIS — O09529 Supervision of elderly multigravida, unspecified trimester: Secondary | ICD-10-CM

## 2023-08-25 DIAGNOSIS — Z8759 Personal history of other complications of pregnancy, childbirth and the puerperium: Secondary | ICD-10-CM

## 2023-08-25 DIAGNOSIS — Z98891 History of uterine scar from previous surgery: Secondary | ICD-10-CM

## 2023-08-25 DIAGNOSIS — O99013 Anemia complicating pregnancy, third trimester: Secondary | ICD-10-CM

## 2023-08-25 DIAGNOSIS — D563 Thalassemia minor: Secondary | ICD-10-CM

## 2023-08-25 DIAGNOSIS — O99210 Obesity complicating pregnancy, unspecified trimester: Secondary | ICD-10-CM

## 2023-08-25 NOTE — Progress Notes (Signed)
   PRENATAL VISIT NOTE  Subjective:  Caitlin Gutierrez is a 38 y.o. G2P1001 at [redacted]w[redacted]d being seen today for ongoing prenatal care.  She is currently monitored for the following issues for this high-risk pregnancy and has Anemia affecting pregnancy in third trimester; Sickle cell trait (HCC); Uterine fibroids affecting pregnancy; Alpha thalassemia silent carrier; Supervision of high risk pregnancy, antepartum; AMA (advanced maternal age) multigravida 35+, unspecified trimester; Obesity affecting pregnancy; History of gestational hypertension; and H/O: C-section on their problem list.  Patient reports no complaints.   .  .   . Denies leaking of fluid.   The following portions of the patient's history were reviewed and updated as appropriate: allergies, current medications, past family history, past medical history, past social history, past surgical history and problem list.   Objective:  There were no vitals filed for this visit.  Fetal Status:           General:  Alert, oriented and cooperative. Patient is in no acute distress.  Skin: Skin is warm and dry. No rash noted.   Cardiovascular: Normal heart rate noted  Respiratory: Normal respiratory effort, no problems with respiration noted  Abdomen: Soft, gravid, appropriate for gestational age.        Pelvic: Cervical exam deferred        Extremities: Normal range of motion.     Mental Status: Normal mood and affect. Normal behavior. Normal judgment and thought content.   Assessment and Plan:  Pregnancy: G2P1001 at [redacted]w[redacted]d 1. AMA (advanced maternal age) multigravida 35+, unspecified trimester (Primary) - low risk NIPS  2. Supervision of high risk pregnancy, antepartum   3. Obesity affecting pregnancy, antepartum, unspecified obesity type - on baby asa - had elevated A1C, normal 2 hour glucola - will get repeat glucola in 2 weeks  4. History of gestational hypertension - had baseline labs drawn earlier  5. H/O: C-section - we discussed  TOLAC versus RCS. She does not want more kids in the future. She is undecided at this time  6. Sickle cell trait (HCC)   7. Alpha thalassemia silent carrier - FOB is not a carrier  8. Anemia affecting pregnancy in third trimester - recheck CBC at next visit  Preterm labor symptoms and general obstetric precautions including but not limited to vaginal bleeding, contractions, leaking of fluid and fetal movement were reviewed in detail with the patient. Please refer to After Visit Summary for other counseling recommendations.   Return in about 2 weeks (around 09/08/2023). For glucola, tdap, labs  Future Appointments  Date Time Provider Department Center  09/29/2023  3:30 PM [facility]    Harland JAYSON Birkenhead, MD

## 2023-08-25 NOTE — Progress Notes (Signed)
No C/O today

## 2023-09-08 ENCOUNTER — Ambulatory Visit: Payer: Medicaid Other | Admitting: Obstetrics and Gynecology

## 2023-09-08 ENCOUNTER — Other Ambulatory Visit: Payer: Medicaid Other

## 2023-09-08 VITALS — BP 122/72 | HR 95 | Wt 201.4 lb

## 2023-09-08 DIAGNOSIS — O99013 Anemia complicating pregnancy, third trimester: Secondary | ICD-10-CM

## 2023-09-08 DIAGNOSIS — Z3A28 28 weeks gestation of pregnancy: Secondary | ICD-10-CM

## 2023-09-08 DIAGNOSIS — O0990 Supervision of high risk pregnancy, unspecified, unspecified trimester: Secondary | ICD-10-CM

## 2023-09-08 DIAGNOSIS — D509 Iron deficiency anemia, unspecified: Secondary | ICD-10-CM

## 2023-09-08 DIAGNOSIS — T7840XD Allergy, unspecified, subsequent encounter: Secondary | ICD-10-CM

## 2023-09-08 DIAGNOSIS — O24410 Gestational diabetes mellitus in pregnancy, diet controlled: Secondary | ICD-10-CM

## 2023-09-08 MED ORDER — CETIRIZINE HCL 10 MG PO TABS: 10.0000 mg | ORAL_TABLET | Freq: Every day | ORAL | 12 refills | Status: AC

## 2023-09-08 NOTE — Progress Notes (Signed)
  HIGH-RISK PREGNANCY OFFICE VISIT Patient name: Caitlin Gutierrez MRN [id number]  Date of birth: 12/09/1985 Chief Complaint:   Routine Prenatal Visit  History of Present Illness:   Caitlin Gutierrez is a 38 y.o. 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 Today she reports  needing a refill on allergy medication . Contractions: Not present. Vag. Bleeding: None.  Movement: Present. denies leaking of fluid.  Review of Systems:   Pertinent items are noted in HPI Denies abnormal vaginal discharge w/ itching/odor/irritation, headaches, visual changes, shortness of breath, chest pain, abdominal pain, severe nausea/vomiting, or problems with urination or bowel movements unless otherwise stated above. Pertinent History Reviewed:  Reviewed past medical,surgical, social, obstetrical and family history.  Reviewed problem list, medications and allergies. Physical Assessment:   Vitals:   09/08/23 1053  BP: 122/72  Pulse: 95  Weight: 201 lb 6.4 oz (91.4 kg)  Body mass index is 34.57 kg/m.           Physical Examination:   General appearance: alert, well appearing, and in no distress and overweight  Mental status: alert, oriented to person, place, and time, normal mood, behavior, speech, dress, motor activity, and thought processes  Skin: warm & dry   Extremities: Edema: None    Cardiovascular: normal heart rate noted  Respiratory: normal respiratory effort, no distress  Abdomen: gravid, soft, non-tender  Pelvic: Cervical exam deferred         Fetal Status: Fetal Heart Rate (bpm): 156 Fundal Height: 31 cm Movement: Present    Fetal Surveillance Testing today: none   No results found for this or any previous visit (from the past 24 hours).  Assessment & Plan:  1) High-risk pregnancy G2P1001 at [redacted]w[redacted]d with an Estimated Date of  Delivery: 11/24/23   2) Supervision of high risk pregnancy, antepartum (Primary) - Glucose Tolerance, 2 Hours w/1 Hour - RPR - HIV antibody (with reflex) - CBC  - Offered tdaP and RSV today -- patient will get both at [facility] - Patient desires RCS -- advised will need to see MD for next visit to discuss  3) Allergy, subsequent encounter - Prescription for: cetirizine (ZYRTEC ALLERGY) 10 MG tablet; Take 1 tablet (10 mg total) by mouth daily.  Dispense: 30 tablet; Refill: 12  4) [redacted] weeks gestation of pregnancy   Meds:  Meds ordered this encounter  Medications   cetirizine (ZYRTEC ALLERGY) 10 MG tablet    Sig: Take 1 tablet (10 mg total) by mouth daily.    Dispense:  30 tablet    Refill:  12    Supervising Provider:   Reva Bores [[id number]]    Labs/procedures today: 2 hr GTT & 3rd trimester labs  Treatment Plan:  see MD to discuss RCS, tdaP & RSV at nv  Reviewed: Preterm labor symptoms and general obstetric precautions including but not limited to vaginal bleeding, contractions, leaking of fluid and fetal movement were reviewed in detail with the patient.  All questions were answered. Has home bp cuff. Check bp weekly, let us know if >140/90.   Follow-up: Return in about 2 weeks (around 09/22/2023).  Orders Placed This Encounter  Procedures   Glucose Tolerance, 2 Hours w/1 Hour   RPR   HIV antibody (with reflex)   CBC   Raelyn Mora MSN, CNM 09/08/2023 1:03 PM

## 2023-09-08 NOTE — Progress Notes (Signed)
Wants allergy meds ordered today.

## 2023-09-09 ENCOUNTER — Encounter: Payer: Self-pay | Admitting: Obstetrics and Gynecology

## 2023-09-09 LAB — CBC
Hematocrit: 28 % — ABNORMAL LOW (ref 34.0–46.6)
Hemoglobin: 9.7 g/dL — ABNORMAL LOW (ref 11.1–15.9)
MCH: 27.2 pg (ref 26.6–33.0)
MCHC: 34.6 g/dL (ref 31.5–35.7)
MCV: 79 fL (ref 79–97)
Platelets: 231 10*3/uL (ref 150–450)
RBC: 3.56 x10E6/uL — ABNORMAL LOW (ref 3.77–5.28)
RDW: 12.8 % (ref 11.7–15.4)
WBC: 5.9 10*3/uL (ref 3.4–10.8)

## 2023-09-09 LAB — RPR: RPR Ser Ql: NONREACTIVE

## 2023-09-09 LAB — GLUCOSE TOLERANCE, 2 HOURS W/ 1HR
Glucose, 1 hour: 178 mg/dL (ref 70–179)
Glucose, 2 hour: 153 mg/dL — ABNORMAL HIGH (ref 70–152)
Glucose, Fasting: 77 mg/dL (ref 70–91)

## 2023-09-09 LAB — HIV ANTIBODY (ROUTINE TESTING W REFLEX): HIV Screen 4th Generation wRfx: NONREACTIVE

## 2023-09-09 MED ORDER — ACCU-CHEK SOFTCLIX LANCETS MISC: 12 refills | Status: DC

## 2023-09-09 MED ORDER — ASCORBIC ACID 500 MG PO TABS: 500.0000 mg | ORAL_TABLET | ORAL | 3 refills | Status: DC

## 2023-09-09 MED ORDER — ACCU-CHEK GUIDE W/DEVICE KIT: 1.0000 | PACK | Freq: Four times a day (QID) | 0 refills | Status: DC

## 2023-09-09 MED ORDER — FERROUS SULFATE 325 (65 FE) MG PO TBEC: 325.0000 mg | DELAYED_RELEASE_TABLET | ORAL | 3 refills | Status: AC

## 2023-09-09 NOTE — Addendum Note (Signed)
Addended by: Kenard Gower on: 09/09/2023 11:07 AM   Modules accepted: Orders

## 2023-09-09 NOTE — Addendum Note (Signed)
Addended by: Kenard Gower on: 09/09/2023 11:03 AM   Modules accepted: Orders

## 2023-09-14 ENCOUNTER — Ambulatory Visit: Payer: Medicaid Other

## 2023-09-21 ENCOUNTER — Encounter: Payer: Medicaid Other | Attending: Obstetrics and Gynecology | Admitting: Dietician

## 2023-09-21 DIAGNOSIS — O24419 Gestational diabetes mellitus in pregnancy, unspecified control: Secondary | ICD-10-CM | POA: Diagnosis present

## 2023-09-21 NOTE — Progress Notes (Signed)
Patient was seen on 09/21/2023 for Gestational Diabetes self-management class at the [facility]. The following learning objectives were met by the patient during this course:  States the definition of Gestational Diabetes States why dietary management is important in controlling blood glucose Describes the effects each nutrient has on blood glucose levels Demonstrates ability to create a balanced meal plan Demonstrates carbohydrate counting  States when to check blood glucose levels Demonstrates proper blood glucose monitoring techniques States the effect of stress and exercise on blood glucose levels States the importance of limiting caffeine and abstaining from alcohol and smoking  Blood glucose monitor: Pt present today with accu chek meter and supplies  Blood glucose reading: 77 mg/dL, pre meal per Pt reporting  Patient instructed to monitor glucose levels: QID FBS: 60 - <90 1 hour: <140 2 hour: <120  *Patient received handouts: Nutrition Diabetes and Pregnancy Carbohydrate Counting List Blood glucose log Snack ideas for diabetes during pregnancy  Patient will be seen for follow-up as needed.

## 2023-09-22 ENCOUNTER — Ambulatory Visit: Payer: Medicaid Other | Admitting: Obstetrics and Gynecology

## 2023-09-22 ENCOUNTER — Encounter: Payer: Self-pay | Admitting: Obstetrics and Gynecology

## 2023-09-22 VITALS — BP 120/76 | HR 83 | Wt 203.2 lb

## 2023-09-22 DIAGNOSIS — D573 Sickle-cell trait: Secondary | ICD-10-CM

## 2023-09-22 DIAGNOSIS — Z23 Encounter for immunization: Secondary | ICD-10-CM

## 2023-09-22 DIAGNOSIS — Z98891 History of uterine scar from previous surgery: Secondary | ICD-10-CM | POA: Diagnosis not present

## 2023-09-22 DIAGNOSIS — D259 Leiomyoma of uterus, unspecified: Secondary | ICD-10-CM

## 2023-09-22 DIAGNOSIS — D563 Thalassemia minor: Secondary | ICD-10-CM

## 2023-09-22 DIAGNOSIS — O24419 Gestational diabetes mellitus in pregnancy, unspecified control: Secondary | ICD-10-CM | POA: Diagnosis not present

## 2023-09-22 DIAGNOSIS — O3413 Maternal care for benign tumor of corpus uteri, third trimester: Secondary | ICD-10-CM

## 2023-09-22 DIAGNOSIS — O0993 Supervision of high risk pregnancy, unspecified, third trimester: Secondary | ICD-10-CM | POA: Diagnosis not present

## 2023-09-22 DIAGNOSIS — Z3A31 31 weeks gestation of pregnancy: Secondary | ICD-10-CM

## 2023-09-22 DIAGNOSIS — Z8759 Personal history of other complications of pregnancy, childbirth and the puerperium: Secondary | ICD-10-CM

## 2023-09-22 DIAGNOSIS — O0990 Supervision of high risk pregnancy, unspecified, unspecified trimester: Secondary | ICD-10-CM

## 2023-09-22 DIAGNOSIS — O09529 Supervision of elderly multigravida, unspecified trimester: Secondary | ICD-10-CM

## 2023-09-22 DIAGNOSIS — O99013 Anemia complicating pregnancy, third trimester: Secondary | ICD-10-CM

## 2023-09-22 MED ORDER — GLUCOSE BLOOD VI STRP: ORAL_STRIP | 12 refills | Status: DC

## 2023-09-22 NOTE — Progress Notes (Signed)
PRENATAL VISIT NOTE  Subjective:  Caitlin Gutierrez is a 38 y.o. G2P1001 at [redacted]w[redacted]d being seen today for ongoing prenatal care.  She is currently monitored for the following issues for this high-risk pregnancy and has Anemia affecting pregnancy in third trimester; Sickle cell trait (HCC); Uterine fibroids affecting pregnancy; Alpha thalassemia silent carrier; Supervision of high risk pregnancy, antepartum; AMA (advanced maternal age) multigravida 35+, unspecified trimester; Obesity affecting pregnancy; History of gestational hypertension; and H/O: C-section on their problem list.  Patient reports  doing well overall .  Contractions: Not present. Vag. Bleeding: None.  Movement: Present. Denies leaking of fluid.   The following portions of the patient's history were reviewed and updated as appropriate: allergies, current medications, past family history, past medical history, past social history, past surgical history and problem list.   Objective:   Vitals:   09/22/23 1511  BP: 120/76  Pulse: 83  Weight: 203 lb 3.2 oz (92.2 kg)    Fetal Status: Fetal Heart Rate (bpm): 150   Movement: Present     General:  Alert, oriented and cooperative. Patient is in no acute distress.  Skin: Skin is warm and dry. No rash noted.   Cardiovascular: Normal heart rate noted  Respiratory: Normal respiratory effort, no problems with respiration noted  Abdomen: Soft, gravid, appropriate for gestational age.  Pain/Pressure: Absent      Assessment and Plan:  Pregnancy: G2P1001 at [redacted]w[redacted]d 1. Supervision of high risk pregnancy, antepartum (Primary) 2. [redacted] weeks gestation of pregnancy Tdap today Reviewed RSV vaccine at 32 weeks at pharmacy  3. Gestational diabetes mellitus (GDM) in third trimester, gestational diabetes method of control unspecified - Reviewed diagnosis of GDM - Discussed the risks associated in pregnancy especially with poor control including but not limited to increased risk of preeclampsia,  macrosomia, need for operative delivery I.e. vacuum, forcep, c-section, shoulder dystocia and resulting potential nerve injury.  - Discussed diet and exercise modifications. Diabetes education referral completed. She has not started checking sugars because she needs test strips. Ordered today - We discussed the possibility of management of the pregnancy with medications I.e. Metformin or Insulin. Discussed if medication initiated, that monitoring during the pregnancy will be started at 32 wks or at the time of medication initiation. Serial [place] for growth would be started as well. Discussed that the timing of delivery would then be in the 39th week - Counseled on importance of postpartum follow up testing to ensure resolution and ensure patient does not have ongoing DM - Discussed lifelong increased risk of DM and increased risk of GDM in future pregnancies  4. H/O: C-section - We discussed her history of c-section. Her previous c-section was due to   failed induction of labor at 5cm; infant weight 3610g .  She has a history of  no prior successful vaginal deliveries - We discussed the risks associated with repeat c-section: bleeding, infection, injury to surrounding organs/tissues I.e. bowel/bladder, development of scar tissue, wound complications such as wound separation or infection, need for additional surgery, percreta/acreta - We discussed the risks associated with TOLAC: risk of it being unsuccessful, specially in the context of her history, the risks in general of a vaginal delivery (prolapse, SUI, differences in recovery, pelvic floor dysfunction, etc), and the risk of uterine rupture. We discussed with the risk of uterine rupture that while rare it is not easily predicted, that it is a surgical emergency, and it can be potentially catastrophic for mom and baby. We discussed if uterine rupture that  it may necessitate hysterectomy if the rupture caused issues with bleeding that could not be managed  with other surgical options.  - After counseling, the patient was given the opportunity to ask questions and all questions answered.  - After considering her options, she would like to have a repeat c-section - Information provided to the patient - Requests 3/25 or 3/28. Referral sent.   5. History of gestational hypertension ldASA Normotensive, normal baseline labs  6. Leiomyoma of uterus affecting pregnancy in third trimester 12/12: anterior 5.7 x 8.2 x 3.5 & fundal 4.3 x 5.3 x 2.6; EFW 668g (29%) No comment on fibroids in op note from 2023 Growth [place] scheduled 2/6  7. Anemia affecting pregnancy in third trimester 28w Hgb 9.7 Taking po iron QoD already, will obtain iron studies, B12/folate to confirm source of anemia Discussed possible iron infusions pending result  8. Sickle cell trait (HCC) 9. Alpha thalassemia silent carrier Partner negative for SCT; also silent carrier for alpha thal so no risk for Hgb barts or Hgb H disease  10. AMA (advanced maternal age) multigravida 35+, unspecified trimester ldASA  Please refer to After Visit Summary for other counseling recommendations.   Future Appointments  Date Time Provider Department Center  09/29/2023  2:30 PM Raelyn Mora, CNM [facility] None  09/29/2023  3:30 PM [facility]  10/20/2023  3:30 PM Sue Lush, FNP [facility] None   Lennart Pall, MD

## 2023-09-22 NOTE — Progress Notes (Addendum)
Pt presents for ROB. Pt has not been able to check CBGs because she does not have any strips for the meter. Order placed. No questions or concerns. TDAP today

## 2023-09-23 ENCOUNTER — Encounter: Payer: Self-pay | Admitting: Obstetrics and Gynecology

## 2023-09-23 LAB — IRON AND TIBC
Iron Saturation: 23 % (ref 15–55)
Iron: 85 ug/dL (ref 27–159)
Total Iron Binding Capacity: 363 ug/dL (ref 250–450)
UIBC: 278 ug/dL (ref 131–425)

## 2023-09-23 LAB — FERRITIN: Ferritin: 78 ng/mL (ref 15–150)

## 2023-09-29 ENCOUNTER — Ambulatory Visit: Payer: Medicaid Other | Attending: Maternal & Fetal Medicine | Admitting: Maternal & Fetal Medicine

## 2023-09-29 ENCOUNTER — Ambulatory Visit: Payer: Medicaid Other | Attending: Obstetrics and Gynecology

## 2023-09-29 ENCOUNTER — Ambulatory Visit (INDEPENDENT_AMBULATORY_CARE_PROVIDER_SITE_OTHER): Payer: Medicaid Other | Admitting: Obstetrics and Gynecology

## 2023-09-29 VITALS — BP 131/84 | HR 91 | Wt 203.0 lb

## 2023-09-29 DIAGNOSIS — D573 Sickle-cell trait: Secondary | ICD-10-CM | POA: Insufficient documentation

## 2023-09-29 DIAGNOSIS — O24410 Gestational diabetes mellitus in pregnancy, diet controlled: Secondary | ICD-10-CM | POA: Insufficient documentation

## 2023-09-29 DIAGNOSIS — Z3A32 32 weeks gestation of pregnancy: Secondary | ICD-10-CM

## 2023-09-29 DIAGNOSIS — Z3A27 27 weeks gestation of pregnancy: Secondary | ICD-10-CM | POA: Diagnosis not present

## 2023-09-29 DIAGNOSIS — O99210 Obesity complicating pregnancy, unspecified trimester: Secondary | ICD-10-CM | POA: Diagnosis not present

## 2023-09-29 DIAGNOSIS — O3412 Maternal care for benign tumor of corpus uteri, second trimester: Secondary | ICD-10-CM | POA: Insufficient documentation

## 2023-09-29 DIAGNOSIS — Z8759 Personal history of other complications of pregnancy, childbirth and the puerperium: Secondary | ICD-10-CM

## 2023-09-29 DIAGNOSIS — O24419 Gestational diabetes mellitus in pregnancy, unspecified control: Secondary | ICD-10-CM | POA: Diagnosis not present

## 2023-09-29 DIAGNOSIS — O0990 Supervision of high risk pregnancy, unspecified, unspecified trimester: Secondary | ICD-10-CM | POA: Diagnosis not present

## 2023-09-29 DIAGNOSIS — D563 Thalassemia minor: Secondary | ICD-10-CM | POA: Insufficient documentation

## 2023-09-29 DIAGNOSIS — O99019 Anemia complicating pregnancy, unspecified trimester: Secondary | ICD-10-CM | POA: Diagnosis present

## 2023-09-29 DIAGNOSIS — Z98891 History of uterine scar from previous surgery: Secondary | ICD-10-CM | POA: Diagnosis not present

## 2023-09-29 DIAGNOSIS — O99013 Anemia complicating pregnancy, third trimester: Secondary | ICD-10-CM

## 2023-09-29 DIAGNOSIS — O99212 Obesity complicating pregnancy, second trimester: Secondary | ICD-10-CM | POA: Diagnosis present

## 2023-09-29 DIAGNOSIS — O09522 Supervision of elderly multigravida, second trimester: Secondary | ICD-10-CM | POA: Diagnosis present

## 2023-09-29 DIAGNOSIS — O34219 Maternal care for unspecified type scar from previous cesarean delivery: Secondary | ICD-10-CM

## 2023-09-29 DIAGNOSIS — O09523 Supervision of elderly multigravida, third trimester: Secondary | ICD-10-CM

## 2023-09-29 DIAGNOSIS — O3413 Maternal care for benign tumor of corpus uteri, third trimester: Secondary | ICD-10-CM

## 2023-09-29 DIAGNOSIS — D259 Leiomyoma of uterus, unspecified: Secondary | ICD-10-CM

## 2023-09-29 DIAGNOSIS — D5740 Sickle-cell thalassemia without crisis: Secondary | ICD-10-CM | POA: Diagnosis not present

## 2023-09-29 NOTE — Progress Notes (Signed)
 Patient information  Patient Name: Caitlin Gutierrez  Patient MRN:   [id number]  Referring practice: [facility] Referring Provider: [facility]  MFM CONSULT  Caitlin Gutierrez is a 38 y.o. G2P1001 at [redacted]w[redacted]d here for ultrasound and consultation. Patient Active Problem List   Diagnosis Date Noted   History of gestational hypertension 07/13/2023   H/O: C-section 07/13/2023   AMA (advanced maternal age) multigravida 35+, unspecified trimester 06/28/2023   Obesity affecting pregnancy 06/28/2023   Supervision of high risk pregnancy, antepartum 05/02/2023   Alpha thalassemia silent carrier 07/10/2021   Sickle cell trait (HCC) 06/17/2021   Uterine fibroids affecting pregnancy 06/17/2021   Anemia affecting pregnancy in third trimester 05/06/2021    Caitlin Gutierrez is doing well today with no acute concerns. She denies contractions, bleeding, or loss of fluid and reports good fetal movement.   RE uterine myomas: There are 2 uterine fibroids that are intramural on the anterior surface of the uterus above the lower segment.  These measure approximately 2 x 4 cm and 3 x 5 cm.  I discussed the clinical significance of uterine fibroids during pregnancy.  I discussed that approximately one third will grow during pregnancy while the other third will shrink and then the remainder will stay the same.  The most common complication is pain if they start to degenerate.  Currently the patient is doing well without complaint.  I also encouraged the patient that I do not think this will complicate her delivery since they are above the lower uterine segment where a cesarean scar would be.  Due to the potential for growth abnormalities a follow-up growth ultrasound is recommended in 4 to 6 weeks.  RE gestational diabetes: Patient was recently diagnosed gestational diabetes that is well-controlled with her diet.  She was counseled by her OB provider regarding the importance of strict glycemic control.  I also reemphasized  some of these points discussing the importance of proper glycemic control to minimize the risk to her and her fetus.   Sonographic findings Single intrauterine pregnancy at 32w 3d.  Fetal cardiac activity:  Observed and appears normal. Presentation: Cephalic. Interval fetal anatomy appears normal. Fetal biometry shows the estimated fetal weight at the 20 percentile. Amniotic fluid volume: Within normal limits. MVP: 5.56 cm. Placenta: Anterior. Uterus: Anterior uterine fibroid measuring 3 x 1.8 x 4.2 cm.  Fundal uterine fibroid measuring 5.8 x 2.2 x 5.5 cm.  There are limitations of prenatal ultrasound such as the inability to detect certain abnormalities due to poor visualization. Various factors such as fetal position, gestational age and maternal body habitus may increase the difficulty in visualizing the fetal anatomy.    Recommendations -Follow-up growth ultrasound in 4 to 6 weeks -Continue glucose checks and present log to her OB provider.  I reviewed her log today and she is doing an excellent job.  -Antenatal testing is indicated if there is medication required for glucose management.  At this time there is no need for antenatal testing.  -Mindfulness of uterine fibroid location when performing repeat cesarean delivery.   ROS Negative except per HPI   Vitals and Physical Exam    09/29/2023    3:38 PM 09/29/2023    2:46 PM 09/22/2023    3:11 PM  Vitals with BMI  Weight  203 lbs 203 lbs 3 oz  Systolic 121 131 879  Diastolic 62 84 76  Pulse 91 91 83    Sitting comfortably on the sonogram table Nonlabored breathing Normal rate and  rhythm Abdomen is nontender  Past pregnancies OB History  Gravida Para Term Preterm AB Living  2 1 1  0 0 1  SAB IAB Ectopic Multiple Live Births  0 0 0 0 1    # Outcome Date GA Lbr Len/2nd Weight Sex Type Anes PTL Lv  2 Current           1 Term 11/10/21 [redacted]w[redacted]d  7 lb 15.3 oz (3.61 kg) M CS-LTranv EPI  LIV     I spent 30 minutes reviewing the  patients chart, including labs and images as well as counseling the patient about her medical conditions. Greater than 50% of the time was spent in direct face-to-face patient counseling.  Delora Smaller  MFM, [facility]   09/29/2023  4:47 PM

## 2023-09-29 NOTE — Progress Notes (Signed)
 No c/o today

## 2023-09-29 NOTE — Progress Notes (Signed)
 HIGH-RISK PREGNANCY OFFICE VISIT Patient name: Caitlin Gutierrez MRN [id number]  Date of birth: 08-24-1985 Chief Complaint:   Routine Prenatal Visit  History of Present Illness:   Caitlin Gutierrez is a 38 y.o. G41P1001 female at [redacted]w[redacted]d with an Estimated Date of Delivery: 11/21/23 being seen today for ongoing management of a high-risk pregnancy complicated by A1DM, h/o C/S, h/o gHTN, AMA, and uterine fibroids affecting pregnancy. Today she reports no complaints. Contractions: Not present. Vag. Bleeding: None.  Movement: Present. denies leaking of fluid.  Review of Systems:   Pertinent items are noted in HPI Denies abnormal vaginal discharge w/ itching/odor/irritation, headaches, visual changes, shortness of breath, chest pain, abdominal pain, severe nausea/vomiting, or problems with urination or bowel movements unless otherwise stated above. Pertinent History Reviewed:  Reviewed past medical,surgical, social, obstetrical and family history.  Reviewed problem list, medications and allergies. Physical Assessment:   Vitals:   09/29/23 1446  BP: 131/84  Pulse: 91  Weight: 203 lb (92.1 kg)  Body mass index is 34.84 kg/m.           Physical Examination:   General appearance: alert, well appearing, and in no distress, oriented to person, place, and time, and normal appearing weight  Mental status: alert, oriented to person, place, and time, normal mood, behavior, speech, dress, motor activity, and thought processes  Skin: warm & dry   Extremities: Edema: None    Cardiovascular: normal heart rate noted  Respiratory: normal respiratory effort, no distress  Abdomen: gravid, soft, non-tender  Pelvic: Cervical exam deferred         Fetal Status: Fetal Heart Rate (bpm): 148 Fundal Height: 37 cm Movement: Present    Fetal Surveillance Testing today: none   No results found for this or any previous visit (from the past 24 hours).  Assessment & Plan:  1) High-risk pregnancy G2P1001 at [redacted]w[redacted]d with an  Estimated Date of Delivery: 11/21/23   2) Supervision of high risk pregnancy, antepartum (Primary) - Anticipatory guidance for GBS screening at 36 wks. Explained the test is important to be done at this time in pregnancy to ensure adequate treatment at the time of delivery. Explained that a positive result does not mean any harm to her, but can be harmful to the baby. Meaning that if baby is exposed to the bacteria for too long without antibiotics, the baby has the potential to develop pneumonia, septicemia, or spinal meningitis and could end up in the NICU. Also, explained that a cervical exam may be performed at the time of testing to get a baseline cervical check and make sure there is no preterm cervical dilation.  3) Gestational diabetes mellitus (GDM) in third trimester, gestational diabetes method of control unspecified - Review of Blood Sugar Levels: FBS range = 77-94 mg/dL  2 hr PP range = 21-868  4) H/O: C-section - Repeat C/S scheduled for 11/15/2023  5) History of gestational hypertension - BP WNL  6) [redacted] weeks gestation of pregnancy   Meds: No orders of the defined types were placed in this encounter.   Labs/procedures today: none  Treatment Plan:  continue checking BS as previously prescribed  Reviewed: Preterm labor symptoms and general obstetric precautions including but not limited to vaginal bleeding, contractions, leaking of fluid and fetal movement were reviewed in detail with the patient.  All questions were answered. Has home bp cuff. Check bp weekly, let us  know if >140/90.   Follow-up: Return in about 2 weeks (around 10/13/2023) for Return OB visit.  No  orders of the defined types were placed in this encounter.  Caitlin Mccosh MSN, CNM 09/29/2023 3:22 PM

## 2023-09-30 ENCOUNTER — Other Ambulatory Visit: Payer: Self-pay | Admitting: *Deleted

## 2023-09-30 DIAGNOSIS — O24419 Gestational diabetes mellitus in pregnancy, unspecified control: Secondary | ICD-10-CM

## 2023-10-03 ENCOUNTER — Other Ambulatory Visit: Payer: Self-pay | Admitting: Obstetrics and Gynecology

## 2023-10-03 DIAGNOSIS — O0990 Supervision of high risk pregnancy, unspecified, unspecified trimester: Secondary | ICD-10-CM

## 2023-10-20 ENCOUNTER — Ambulatory Visit (INDEPENDENT_AMBULATORY_CARE_PROVIDER_SITE_OTHER): Payer: Medicaid Other | Admitting: Obstetrics and Gynecology

## 2023-10-20 VITALS — BP 126/72 | HR 94 | Wt 204.4 lb

## 2023-10-20 DIAGNOSIS — Z8759 Personal history of other complications of pregnancy, childbirth and the puerperium: Secondary | ICD-10-CM | POA: Diagnosis not present

## 2023-10-20 DIAGNOSIS — Z3A35 35 weeks gestation of pregnancy: Secondary | ICD-10-CM

## 2023-10-20 DIAGNOSIS — O24419 Gestational diabetes mellitus in pregnancy, unspecified control: Secondary | ICD-10-CM

## 2023-10-20 DIAGNOSIS — Z98891 History of uterine scar from previous surgery: Secondary | ICD-10-CM

## 2023-10-20 DIAGNOSIS — O0990 Supervision of high risk pregnancy, unspecified, unspecified trimester: Secondary | ICD-10-CM | POA: Diagnosis not present

## 2023-10-20 NOTE — Progress Notes (Signed)
 ROB, no concerns. Has blood sugar log, states sometimes they are good, sometimes high.

## 2023-10-20 NOTE — Progress Notes (Signed)
   PRENATAL VISIT NOTE  Subjective:  Caitlin Gutierrez is a 38 y.o. G2P1001 at [redacted]w[redacted]d being seen today for ongoing prenatal care.  She is currently monitored for the following issues for this high-risk pregnancy and has Anemia affecting pregnancy in third trimester; Sickle cell trait (HCC); Uterine fibroids affecting pregnancy; Alpha thalassemia silent carrier; Supervision of high risk pregnancy, antepartum; AMA (advanced maternal age) multigravida 35+, unspecified trimester; Obesity affecting pregnancy; History of gestational hypertension; H/O: C-section; and Gestational diabetes mellitus in pregnancy, diet controlled on their problem list.  Patient reports no complaints.  Contractions: Not present. Vag. Bleeding: None.  Movement: Present. Denies leaking of fluid.   The following portions of the patient's history were reviewed and updated as appropriate: allergies, current medications, past family history, past medical history, past social history, past surgical history and problem list.   Objective:   Vitals:   10/20/23 1534  BP: 126/72  Pulse: 94  Weight: 204 lb 6.4 oz (92.7 kg)    Fetal Status: Fetal Heart Rate (bpm): 158   Movement: Present     General:  Alert, oriented and cooperative. Patient is in no acute distress.  Skin: Skin is warm and dry. No rash noted.   Cardiovascular: Normal heart rate noted  Respiratory: Normal respiratory effort, no problems with respiration noted  Abdomen: Soft, gravid, appropriate for gestational age.  Pain/Pressure: Absent     Pelvic: Cervical exam deferred        Extremities: Normal range of motion.  Edema: None  Mental Status: Normal mood and affect. Normal behavior. Normal judgment and thought content.   Assessment and Plan:  Pregnancy: G2P1001 at [redacted]w[redacted]d 1. Supervision of high risk pregnancy, antepartum (Primary) BP and FHR normal Doing well, feeling regular movement    2. Gestational diabetes mellitus (GDM) in third trimester, gestational  diabetes method of control unspecified Blood sugar well controlled with diet 2/6 u/s normal growth, follow up 3/13  3. H/O: C-section RCS scheduled 11/15/23  4. History of gestational hypertension Normotensive, continue ASA  5. [redacted] weeks gestation of pregnancy Anticipatory guidance regarding swabs next visit  Preterm labor symptoms and general obstetric precautions including but not limited to vaginal bleeding, contractions, leaking of fluid and fetal movement were reviewed in detail with the patient. Please refer to After Visit Summary for other counseling recommendations.   Return in about 1 week (around 10/27/2023) for OB VISIT (MD or APP).  Future Appointments  Date Time Provider Department Center  10/27/2023  3:30 PM Colman Cater, NP [facility] None  11/03/2023  9:15 AM [facility] NURSE [facility]  11/03/2023  9:30 AM [facility] US3 [facility]     Albertine Grates, FNP

## 2023-10-21 LAB — VITAMIN B12: Vitamin B-12: 789 pg/mL (ref 232–1245)

## 2023-10-27 ENCOUNTER — Ambulatory Visit: Payer: Medicaid Other | Admitting: Nurse Practitioner

## 2023-10-27 ENCOUNTER — Other Ambulatory Visit (HOSPITAL_COMMUNITY)
Admission: RE | Admit: 2023-10-27 | Discharge: 2023-10-27 | Disposition: A | Discharge status: Home / Self Care | Source: Ambulatory Visit | Attending: Nurse Practitioner | Admitting: Nurse Practitioner

## 2023-10-27 VITALS — BP 127/76 | HR 95 | Wt 207.5 lb

## 2023-10-27 DIAGNOSIS — Z6834 Body mass index (BMI) 34.0-34.9, adult: Secondary | ICD-10-CM

## 2023-10-27 DIAGNOSIS — O3413 Maternal care for benign tumor of corpus uteri, third trimester: Secondary | ICD-10-CM

## 2023-10-27 DIAGNOSIS — Z98891 History of uterine scar from previous surgery: Secondary | ICD-10-CM

## 2023-10-27 DIAGNOSIS — Z3493 Encounter for supervision of normal pregnancy, unspecified, third trimester: Secondary | ICD-10-CM | POA: Insufficient documentation

## 2023-10-27 DIAGNOSIS — Z3A36 36 weeks gestation of pregnancy: Secondary | ICD-10-CM

## 2023-10-27 DIAGNOSIS — O0990 Supervision of high risk pregnancy, unspecified, unspecified trimester: Secondary | ICD-10-CM | POA: Diagnosis not present

## 2023-10-27 DIAGNOSIS — O99013 Anemia complicating pregnancy, third trimester: Secondary | ICD-10-CM

## 2023-10-27 DIAGNOSIS — O24410 Gestational diabetes mellitus in pregnancy, diet controlled: Secondary | ICD-10-CM

## 2023-10-27 DIAGNOSIS — D259 Leiomyoma of uterus, unspecified: Secondary | ICD-10-CM

## 2023-10-27 DIAGNOSIS — D509 Iron deficiency anemia, unspecified: Secondary | ICD-10-CM

## 2023-10-27 DIAGNOSIS — O09523 Supervision of elderly multigravida, third trimester: Secondary | ICD-10-CM

## 2023-10-27 DIAGNOSIS — Z8759 Personal history of other complications of pregnancy, childbirth and the puerperium: Secondary | ICD-10-CM

## 2023-10-27 NOTE — Progress Notes (Signed)
   PRENATAL VISIT NOTE  Subjective:  Caitlin Gutierrez is a 38 y.o. G2P1001 at [redacted]w[redacted]d being seen today for ongoing prenatal care.  She is currently monitored for the following issues for this high-risk pregnancy and has Anemia affecting pregnancy in third trimester; Sickle cell trait (HCC); Uterine fibroids affecting pregnancy; Alpha thalassemia silent carrier; Supervision of high risk pregnancy, antepartum; AMA (advanced maternal age) multigravida 35+, unspecified trimester; Obesity affecting pregnancy; History of gestational hypertension; H/O: C-section; and Gestational diabetes mellitus in pregnancy, diet controlled on their problem list.  Patient reports no c/o at this time.  Contractions: Not present. Vag. Bleeding: None.  Movement: Present. Denies leaking of fluid.   The following portions of the patient's history were reviewed and updated as appropriate: allergies, current medications, past family history, past medical history, past social history, past surgical history and problem list.   Objective:   Vitals:   10/27/23 1549  BP: 127/76  Pulse: 95  Weight: 207 lb 8 oz (94.1 kg)    Fetal Status: Fetal Heart Rate (bpm): 153 Fundal Height: 37 cm Movement: Present     General:  Alert, oriented and cooperative. Patient is in no acute distress.  Skin: Skin is warm and dry. No rash noted.   Cardiovascular: Normal heart rate noted  Respiratory: Normal respiratory effort, no problems with respiration noted  Abdomen: Soft, gravid, appropriate for gestational age.  Pain/Pressure: Absent     Pelvic: Cervical exam deferred      at patient request - GBS cx obtained blind swab  Extremities: Normal range of motion.  Edema: None  Mental Status: Normal mood and affect. Normal behavior. Normal judgment and thought content.   Assessment and Plan:  Pregnancy: G2P1001 at [redacted]w[redacted]d There are no diagnoses linked to this encounter. Preterm labor symptoms and general obstetric precautions including but not  limited to vaginal bleeding, contractions, leaking of fluid and fetal movement were reviewed in detail with the patient. Please refer to After Visit Summary for other counseling recommendations.   1. Supervision of high risk pregnancy, antepartum (Primary) - Feeling well, VSS   2. [redacted] weeks gestation of pregnancy  - Cervicovaginal ancillary only( [place]) - Culture, beta strep (group b only)  3. BMI 34.0-34.9,adult - ASA compliant   4. Diet controlled gestational diabetes mellitus (GDM) in third trimester - Patient did not bring her Blood sugar log - Reports the last couple of days  she had elevated PP >140 -  FS are <90 - Patient reports she had been stressed and that usually increases her CBG's  - Encouraged patient to try and keep her BS logs with her for appointments and we discussed tighter control - Growth q 4 wks   5. Maternal iron deficiency anemia affecting pregnancy in third trimester, antepartum Taking iron supplement  6. AMA (advanced maternal age) multigravida 35+, third trimester   7. H/O: C-section - desires rLTCS  8. H/O gHTN - BP's NM - ASA  daily    9. Fibroids affecting pregnancy - 09/29/23 ultrasound reviewed - Patient not experiencing any discomfort    Return in about 1 week (around 11/03/2023) for [facility].  Future Appointments  Date Time Provider Department Center  11/03/2023  9:15 AM [facility] NURSE [facility]  11/03/2023  9:30 AM [facility] US3 [facility]  11/03/2023 11:15 AM Adam Phenix, MD [facility] None    Colman Cater, NP

## 2023-10-30 LAB — CULTURE, BETA STREP (GROUP B ONLY): Strep Gp B Culture: POSITIVE — AB

## 2023-10-31 ENCOUNTER — Telehealth (HOSPITAL_COMMUNITY): Payer: Self-pay | Admitting: *Deleted

## 2023-10-31 ENCOUNTER — Encounter (HOSPITAL_COMMUNITY): Payer: Self-pay

## 2023-10-31 LAB — CERVICOVAGINAL ANCILLARY ONLY
Bacterial Vaginitis (gardnerella): NEGATIVE
Candida Glabrata: NEGATIVE
Candida Vaginitis: NEGATIVE
Chlamydia: NEGATIVE
Comment: NEGATIVE
Comment: NEGATIVE
Comment: NEGATIVE
Comment: NEGATIVE
Comment: NEGATIVE
Comment: NORMAL
Neisseria Gonorrhea: NEGATIVE
Trichomonas: NEGATIVE

## 2023-10-31 NOTE — Telephone Encounter (Signed)
 Preadmission screen

## 2023-10-31 NOTE — Patient Instructions (Signed)
 Hayslee Casebolt  10/31/2023   Your procedure is scheduled on:  3.25.2025  Arrive at 0745 at Entrance C on [address] at [facility]  and [address]. You are invited to use the FREE valet parking or use the Visitor's parking deck.  Pick up the phone at the desk and dial [phone].  Call this number if you have problems the morning of surgery: [phone]  Remember:   Do not eat food:(After Midnight) Desps de medianoche.  You may drink clear liquids until arrival at __0745___.  Clear liquids means a liquid you can see thru.  It can have color such as Cola or Kool aid.  Tea is OK and coffee as long as no milk or creamer of any kind.  Take these medicines the morning of surgery with A SIP OF WATER:  none   Do not wear jewelry, make-up or nail polish.  Do not wear lotions, powders, or perfumes. Do not wear deodorant.  Do not shave 48 hours prior to surgery.  Do not bring valuables to the hospital.  [facility] is not   responsible for any belongings or valuables brought to the hospital.  Contacts, dentures or bridgework may not be worn into surgery.  Leave suitcase in the car. After surgery it may be brought to your room.  For patients admitted to the hospital, checkout time is 11:00 AM the day of              discharge.      Please read over the following fact sheets that you were given:     Preparing for Surgery

## 2023-11-01 ENCOUNTER — Telehealth (HOSPITAL_COMMUNITY): Payer: Self-pay | Admitting: *Deleted

## 2023-11-01 NOTE — Telephone Encounter (Signed)
 Preadmission screen

## 2023-11-02 ENCOUNTER — Encounter (HOSPITAL_COMMUNITY): Payer: Self-pay

## 2023-11-03 ENCOUNTER — Ambulatory Visit (INDEPENDENT_AMBULATORY_CARE_PROVIDER_SITE_OTHER): Admitting: Obstetrics & Gynecology

## 2023-11-03 ENCOUNTER — Ambulatory Visit: Attending: Obstetrics and Gynecology | Admitting: Obstetrics and Gynecology

## 2023-11-03 ENCOUNTER — Ambulatory Visit: Payer: Medicaid Other

## 2023-11-03 ENCOUNTER — Ambulatory Visit: Payer: Medicaid Other | Attending: Maternal & Fetal Medicine

## 2023-11-03 VITALS — BP 130/84 | HR 93 | Wt 204.0 lb

## 2023-11-03 VITALS — BP 126/72 | HR 89

## 2023-11-03 DIAGNOSIS — Z3A37 37 weeks gestation of pregnancy: Secondary | ICD-10-CM | POA: Diagnosis not present

## 2023-11-03 DIAGNOSIS — O3413 Maternal care for benign tumor of corpus uteri, third trimester: Secondary | ICD-10-CM

## 2023-11-03 DIAGNOSIS — O34219 Maternal care for unspecified type scar from previous cesarean delivery: Secondary | ICD-10-CM

## 2023-11-03 DIAGNOSIS — O09522 Supervision of elderly multigravida, second trimester: Secondary | ICD-10-CM | POA: Diagnosis not present

## 2023-11-03 DIAGNOSIS — D563 Thalassemia minor: Secondary | ICD-10-CM

## 2023-11-03 DIAGNOSIS — D259 Leiomyoma of uterus, unspecified: Secondary | ICD-10-CM

## 2023-11-03 DIAGNOSIS — O99210 Obesity complicating pregnancy, unspecified trimester: Secondary | ICD-10-CM

## 2023-11-03 DIAGNOSIS — O0990 Supervision of high risk pregnancy, unspecified, unspecified trimester: Secondary | ICD-10-CM | POA: Diagnosis present

## 2023-11-03 DIAGNOSIS — O24410 Gestational diabetes mellitus in pregnancy, diet controlled: Secondary | ICD-10-CM | POA: Diagnosis not present

## 2023-11-03 DIAGNOSIS — O09529 Supervision of elderly multigravida, unspecified trimester: Secondary | ICD-10-CM

## 2023-11-03 DIAGNOSIS — Z98891 History of uterine scar from previous surgery: Secondary | ICD-10-CM

## 2023-11-03 DIAGNOSIS — O24419 Gestational diabetes mellitus in pregnancy, unspecified control: Secondary | ICD-10-CM | POA: Insufficient documentation

## 2023-11-03 DIAGNOSIS — O99013 Anemia complicating pregnancy, third trimester: Secondary | ICD-10-CM

## 2023-11-03 NOTE — Progress Notes (Signed)
 Pt had u/s this morning.  Pt is unsure if she wants rpt c/s.

## 2023-11-03 NOTE — Progress Notes (Signed)
   PRENATAL VISIT NOTE  Subjective:  Caitlin Gutierrez is a 38 y.o. G2P1001 at [redacted]w[redacted]d being seen today for ongoing prenatal care.  She is currently monitored for the following issues for this high-risk pregnancy and has Anemia affecting pregnancy in third trimester; Sickle cell trait (HCC); Uterine fibroids affecting pregnancy; Alpha thalassemia silent carrier; Supervision of high risk pregnancy, antepartum; AMA (advanced maternal age) multigravida 35+, unspecified trimester; Obesity affecting pregnancy; History of gestational hypertension; H/O: C-section; and Gestational diabetes mellitus in pregnancy, diet controlled on their problem list.  Patient reports occasional contractions.  Contractions: Not present. Vag. Bleeding: None.  Movement: Present. Denies leaking of fluid.   The following portions of the patient's history were reviewed and updated as appropriate: allergies, current medications, past family history, past medical history, past social history, past surgical history and problem list.   Objective:   Vitals:   11/03/23 1047  BP: 130/84  Pulse: 93  Weight: 204 lb (92.5 kg)    Fetal Status: Fetal Heart Rate (bpm): 140   Movement: Present     General:  Alert, oriented and cooperative. Patient is in no acute distress.  Skin: Skin is warm and dry. No rash noted.   Cardiovascular: Normal heart rate noted  Respiratory: Normal respiratory effort, no problems with respiration noted  Abdomen: Soft, gravid, appropriate for gestational age.  Pain/Pressure: Absent     Pelvic: Cervical exam deferred        Extremities: Normal range of motion.     Mental Status: Normal mood and affect. Normal behavior. Normal judgment and thought content.   Assessment and Plan:  Pregnancy: G2P1001 at [redacted]w[redacted]d 1. Diet controlled gestational diabetes mellitus (GDM) in third trimester (Primary) Good diet control  2. AMA (advanced maternal age) multigravida 35+, unspecified trimester   3. Leiomyoma of uterus  affecting pregnancy in third trimester   4. H/O: C-section Considering TOLAC  5. Supervision of high risk pregnancy, antepartum   6. Obesity affecting pregnancy, antepartum, unspecified obesity type   Term labor symptoms and general obstetric precautions including but not limited to vaginal bleeding, contractions, leaking of fluid and fetal movement were reviewed in detail with the patient. Please refer to After Visit Summary for other counseling recommendations.   Return in about 1 week (around 11/10/2023).  Future Appointments  Date Time Provider Department Center  11/14/2023  9:00 AM [facility] PAT 1 [facility] None    Scheryl Darter, MD

## 2023-11-03 NOTE — Progress Notes (Signed)
 Maternal-Fetal Medicine Consultation I had the pleasure of seeing Ms. Caitlin [facility]. She is G2 P1001 at 37w 3d gestation and is here for fetal growth assessment. -Previous cesarean delivery.  Obstetric history is significant for a term cesarean delivery. -Gestational diabetes.  I reviewed her blood glucose log and they are within normal range.  Diabetes is well-controlled on diet.  Ultrasound Fetal growth is appropriate for gestational age.  Amniotic fluid is in the upper limit of normal good fetal activity seen.  Antenatal testing is reassuring.  BPP 8/8.  Cephalic presentation. 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  I reassured the patient of good diabetic control.  Patient had questions about vaginal birth after cesarean delivery.  I counseled her that trial of labor is associated with less than 1% chance of uterine scar dehiscence.  1 should expect a successful outcome in 70% 80% of cases. Repeat cesarean delivery increases the risk of placenta previa and/or placenta accreta spectrum in future pregnancies.  Cesarean delivery is associated with increased risk of hemorrhage, infection and venous thromboembolism. Patient asked me if she could change her decision to undergo cesarean delivery.  I informed her that she can change her decision anytime. She will discuss with her family and revisit her earlier decision.  Patient does not have symptoms pertaining to her myomas.  Consultation including face-to-face (more than 50%) counseling 20 minutes.

## 2023-11-10 ENCOUNTER — Ambulatory Visit (INDEPENDENT_AMBULATORY_CARE_PROVIDER_SITE_OTHER): Admitting: Obstetrics and Gynecology

## 2023-11-10 VITALS — BP 129/75 | HR 92 | Wt 204.0 lb

## 2023-11-10 DIAGNOSIS — Z98891 History of uterine scar from previous surgery: Secondary | ICD-10-CM

## 2023-11-10 DIAGNOSIS — Z3A38 38 weeks gestation of pregnancy: Secondary | ICD-10-CM

## 2023-11-10 DIAGNOSIS — Z8759 Personal history of other complications of pregnancy, childbirth and the puerperium: Secondary | ICD-10-CM

## 2023-11-10 DIAGNOSIS — O0990 Supervision of high risk pregnancy, unspecified, unspecified trimester: Secondary | ICD-10-CM | POA: Diagnosis not present

## 2023-11-10 DIAGNOSIS — D563 Thalassemia minor: Secondary | ICD-10-CM

## 2023-11-10 DIAGNOSIS — O3413 Maternal care for benign tumor of corpus uteri, third trimester: Secondary | ICD-10-CM

## 2023-11-10 DIAGNOSIS — O24410 Gestational diabetes mellitus in pregnancy, diet controlled: Secondary | ICD-10-CM | POA: Diagnosis not present

## 2023-11-10 DIAGNOSIS — D259 Leiomyoma of uterus, unspecified: Secondary | ICD-10-CM

## 2023-11-10 DIAGNOSIS — O09529 Supervision of elderly multigravida, unspecified trimester: Secondary | ICD-10-CM

## 2023-11-10 DIAGNOSIS — D573 Sickle-cell trait: Secondary | ICD-10-CM

## 2023-11-10 DIAGNOSIS — O99013 Anemia complicating pregnancy, third trimester: Secondary | ICD-10-CM

## 2023-11-10 NOTE — Progress Notes (Signed)
 ROB, has CS scheduled for 11/15/23.  Pt wants to change it to 11/18/2023.

## 2023-11-11 ENCOUNTER — Encounter (HOSPITAL_COMMUNITY): Payer: Self-pay

## 2023-11-11 NOTE — Progress Notes (Signed)
   PRENATAL VISIT NOTE  Subjective:  Caitlin Gutierrez is a 38 y.o. G2P1001 at [redacted]w[redacted]d being seen today for ongoing prenatal care.  She is currently monitored for the following issues for this high-risk pregnancy and has Anemia affecting pregnancy in third trimester; Sickle cell trait (HCC); Uterine fibroids affecting pregnancy; Alpha thalassemia silent carrier; Supervision of high risk pregnancy, antepartum; AMA (advanced maternal age) multigravida 35+, unspecified trimester; Obesity affecting pregnancy; History of gestational hypertension; H/O: C-section; and Gestational diabetes mellitus in pregnancy, diet controlled on their problem list.  Patient reports  doing well overall. Ran out of lancets so hasn't checked sugars in 3 days. Is picking them up today after this visit. Forgot her glucose log. Requests CS changed to 3/28 for childcare issues .  Contractions: Irregular. Vag. Bleeding: None.  Movement: Present. Denies leaking of fluid.   The following portions of the patient's history were reviewed and updated as appropriate: allergies, current medications, past family history, past medical history, past social history, past surgical history and problem list.   Objective:   Vitals:   11/10/23 1622  BP: 129/75  Pulse: 92  Weight: 204 lb (92.5 kg)    Fetal Status: Fetal Heart Rate (bpm): 152   Movement: Present     General:  Alert, oriented and cooperative. Patient is in no acute distress.  Skin: Skin is warm and dry. No rash noted.   Cardiovascular: Normal heart rate noted  Respiratory: Normal respiratory effort, no problems with respiration noted  Abdomen: Soft, gravid, appropriate for gestational age.  Pain/Pressure: Present      Assessment and Plan:  Pregnancy: G2P1001 at [redacted]w[redacted]d 1. Supervision of high risk pregnancy, antepartum (Primary) 2. [redacted] weeks gestation of pregnancy CS rescheduled to 3/28   3. Diet controlled gestational diabetes mellitus (GDM) in third trimester Reports good  control with diet, no log for review today Encouraged her to keep checking sugars and bring log next week Growth 3/13: @[redacted]w[redacted]d  3048g(43%), AC 84%, AFI 24.05, cephalic, posterior  4. H/O: C-section For repeat, message routed to reschedule to 3/28 Confirmed desire for repeat today  5. Leiomyoma of uterus affecting pregnancy in third trimester On growth [place] 3/13 - ant fundal 6.5 x 6.2 x 4.2cm & L lateral 3.8 x 2.9 x 2.3cm  6. Anemia affecting pregnancy in third trimester Hgb 9.7 on 1/16 On PO iron every other day Iron studies at 31 weeks normal, B12 normal Next CBC will be pre op  7. History of gestational hypertension 8. AMA (advanced maternal age) multigravida 35+, unspecified trimester Normotensive ldASA  9. Alpha thalassemia silent carrier 10. Sickle cell trait (HCC) Partner is not a carrier of SCT and is a silent carrier for alpha thal. S/p genetics, there is no risk for alpha thal since both parents are silent carriers  Please refer to After Visit Summary for other counseling recommendations.   Return in about 1 week (around 11/17/2023) for return OB at 39 weeks.  Future Appointments  Date Time Provider Department Center  11/16/2023  4:10 PM Sue Lush, FNP [facility] None  11/17/2023  9:30 AM [facility] PAT 1 [facility] None   Lennart Pall, MD

## 2023-11-11 NOTE — Patient Instructions (Signed)
 Caitlin Gutierrez  11/11/2023   Your procedure is scheduled on:  11/18/2023  Arrive at 0745 at Entrance C on [address] at [facility]  and [address]. You are invited to use the FREE valet parking or use the Visitor's parking deck.  Pick up the phone at the desk and dial [phone].  Call this number if you have problems the morning of surgery: [phone]  Remember:   Do not eat food:(After Midnight) Desps de medianoche.  You may drink clear liquids until arrival at __0745___.  Clear liquids means a liquid you can see thru.  It can have color such as Cola or Kool aid.  Tea is OK and coffee as long as no milk or creamer of any kind.  Take these medicines the morning of surgery with A SIP OF WATER:  none   Do not wear jewelry, make-up or nail polish.  Do not wear lotions, powders, or perfumes. Do not wear deodorant.  Do not shave 48 hours prior to surgery.  Do not bring valuables to the hospital.  [facility] is not   responsible for any belongings or valuables brought to the hospital.  Contacts, dentures or bridgework may not be worn into surgery.  Leave suitcase in the car. After surgery it may be brought to your room.  For patients admitted to the hospital, checkout time is 11:00 AM the day of              discharge.      Please read over the following fact sheets that you were given:     Preparing for Surgery

## 2023-11-13 ENCOUNTER — Other Ambulatory Visit: Payer: Self-pay

## 2023-11-13 ENCOUNTER — Inpatient Hospital Stay (HOSPITAL_COMMUNITY)
Admission: AD | Admit: 2023-11-13 | Discharge: 2023-11-13 | Disposition: A | Discharge status: Home / Self Care | Attending: Obstetrics and Gynecology | Admitting: Obstetrics and Gynecology

## 2023-11-13 ENCOUNTER — Encounter (HOSPITAL_COMMUNITY): Payer: Self-pay | Admitting: Obstetrics and Gynecology

## 2023-11-13 DIAGNOSIS — O24410 Gestational diabetes mellitus in pregnancy, diet controlled: Secondary | ICD-10-CM | POA: Insufficient documentation

## 2023-11-13 DIAGNOSIS — R109 Unspecified abdominal pain: Secondary | ICD-10-CM | POA: Diagnosis not present

## 2023-11-13 DIAGNOSIS — O99013 Anemia complicating pregnancy, third trimester: Secondary | ICD-10-CM | POA: Diagnosis not present

## 2023-11-13 DIAGNOSIS — O09523 Supervision of elderly multigravida, third trimester: Secondary | ICD-10-CM | POA: Diagnosis not present

## 2023-11-13 DIAGNOSIS — O3413 Maternal care for benign tumor of corpus uteri, third trimester: Secondary | ICD-10-CM | POA: Diagnosis not present

## 2023-11-13 DIAGNOSIS — O368130 Decreased fetal movements, third trimester, not applicable or unspecified: Secondary | ICD-10-CM | POA: Insufficient documentation

## 2023-11-13 DIAGNOSIS — D259 Leiomyoma of uterus, unspecified: Secondary | ICD-10-CM | POA: Diagnosis not present

## 2023-11-13 DIAGNOSIS — D573 Sickle-cell trait: Secondary | ICD-10-CM | POA: Diagnosis not present

## 2023-11-13 DIAGNOSIS — O09293 Supervision of pregnancy with other poor reproductive or obstetric history, third trimester: Secondary | ICD-10-CM | POA: Insufficient documentation

## 2023-11-13 DIAGNOSIS — Z3A38 38 weeks gestation of pregnancy: Secondary | ICD-10-CM | POA: Diagnosis not present

## 2023-11-13 LAB — URINALYSIS, ROUTINE W REFLEX MICROSCOPIC
Bilirubin Urine: NEGATIVE
Glucose, UA: NEGATIVE mg/dL
Hgb urine dipstick: NEGATIVE
Ketones, ur: NEGATIVE mg/dL
Leukocytes,Ua: NEGATIVE
Nitrite: NEGATIVE
Protein, ur: NEGATIVE mg/dL
Specific Gravity, Urine: 1.01 (ref 1.005–1.030)
pH: 7 (ref 5.0–8.0)

## 2023-11-13 NOTE — Discharge Instructions (Signed)
 It is very reassuring that you felt your baby move, and the monitor was beautiful! Continue monitoring movement and come back if you have any concerns again.  Make sure you are drinking enough water! Aim for 64-96 ounces of water each day.

## 2023-11-13 NOTE — MAU Note (Signed)
.  Caitlin Gutierrez is a 38 y.o. at [redacted]w[redacted]d here in MAU reporting: decreased fetal movement since 11/12/23 AM with no movement since yesterday morning. Patient also notes irregular contractions that she has not timed but equates to menstrual cramps and rates a 4/10. Denies vaginal bleeding or LOF. Denies HA, visual changes, RUQ pain today. No other complaints today.   Onset of complaint: 11/12/23 AM Pain score: 4/10 Vitals:   11/13/23 1324  BP: 138/75  Pulse: 91  Resp: 18  Temp: 98.4 F (36.9 C)  SpO2: 100%     FHT: 145  Lab orders placed from triage: UA

## 2023-11-13 NOTE — MAU Provider Note (Signed)
 Chief Complaint:  Decreased Fetal Movement   HPI   None     Caitlin Gutierrez is a 38 y.o. G2P1001 at [redacted]w[redacted]d who presents to maternity admissions reporting decreased fetal movement. She has not felt any fetal movement since yesterday morning. She also notes irregular contractions that feel like menstrual cramps. She has not been tracking how much time between cramps. Denies vaginal bleeding, leaking of fluid, headache, visual changes, RUQ pain.  Pregnancy Course: Receives care at [facility]. Prenatal records reviewed. High risk pregnancy monitored for sickle cell trait, uterine fibroids, alpha thalassemia carrier, AMA, diet-controlled gDM, history of gHTN. Reviewed most recent ultrasound. 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  Past Medical History:  Diagnosis Date   Alpha thalassemia silent carrier 07/10/2021   Anemia    Fibroid    Gestational diabetes    Gestational hypertension 11/10/2021   H/O migraine with aura 12/31/2021   OB History  Gravida Para Term Preterm AB Living  2 1 1  0 0 1  SAB IAB Ectopic Multiple Live Births  0 0 0 0 1    # Outcome Date GA Lbr Len/2nd Weight Sex Type Anes PTL Lv  2 Current           1 Term 11/10/21 [redacted]w[redacted]d  3610 g M CS-LTranv EPI  LIV   Past Surgical History:  Procedure Laterality Date   CESAREAN SECTION N/A 11/10/2021   Procedure: CESAREAN SECTION;  Surgeon: Levie Heritage, DO;  Location: [facility] LD ORS;  Service: Obstetrics;  Laterality: N/A;   Family History  Problem Relation Age of Onset   Hypertension Mother    Diabetes Father    Hypertension Father    Social History   Tobacco Use   Smoking status: Never   Smokeless tobacco: Never  Vaping Use   Vaping status: Never Used  Substance Use Topics   Alcohol use: Not Currently   Drug use: Never   No Known Allergies Medications Prior to Admission  Medication Sig Dispense Refill Last Dose/Taking   Accu-Chek Softclix Lancets lancets Use as instructed 100 each 12 Past Week    ascorbic acid (VITAMIN C) 500 MG tablet Take 1 tablet (500 mg total) by mouth every other day. Take with iron pill 30 tablet 3 11/12/2023   aspirin EC 81 MG tablet Take 1 tablet (81 mg total) by mouth daily. Start taking when you are [redacted] weeks pregnant for rest of pregnancy for prevention of preeclampsia 300 tablet 2 11/12/2023   Blood Glucose Monitoring Suppl (ACCU-CHEK GUIDE) w/Device KIT 1 each by Does not apply route 4 (four) times daily. 1 kit 0 Past Week   Blood Pressure Monitoring (BLOOD PRESSURE KIT) DEVI 1 kit by Does not apply route as needed. 1 each 0 Past Week   cetirizine (ZYRTEC ALLERGY) 10 MG tablet Take 1 tablet (10 mg total) by mouth daily. 30 tablet 12 11/12/2023   ferrous sulfate 325 (65 FE) MG EC tablet Take 1 tablet (325 mg total) by mouth every other day. 30 tablet 3 11/12/2023   glucose blood test strip Use to check blood sugar fasting and 2 hours postprandial (after meals) 200 each 12 Past Week   Prenat-Fe Carbonyl-FA-Omega 3 (ONE-A-DAY WOMENS PRENATAL 1) 28-0.8-235 MG CAPS Take 1 capsule by mouth every evening.   11/12/2023   azithromycin (ZITHROMAX) 250 MG tablet Take 1 tablet (250 mg total) by mouth daily. Take first 2 tablets together, then 1 every day until finished. (Patient not taking: Reported on 07/13/2023) 6  tablet 0 Not Taking   ferrous sulfate (FERROUSUL) 325 (65 FE) MG tablet Take 1 tablet (325 mg total) by mouth every other day. (Patient not taking: Reported on 09/29/2023) 30 tablet 3 Not Taking   simethicone (MYLICON) 80 MG chewable tablet Chew 1 tablet (80 mg total) by mouth every 6 (six) hours as needed for flatulence. (Patient not taking: Reported on 08/25/2023) 30 tablet 0 Unknown    I have reviewed patient's Past Medical Hx, Surgical Hx, Family Hx, Social Hx, medications and allergies.   ROS  Pertinent items noted in HPI and remainder of comprehensive ROS otherwise negative.   PHYSICAL EXAM  Patient Vitals for the past 24 hrs:  BP Temp Temp src Pulse Resp SpO2   11/13/23 1346 131/78 -- -- 87 -- --  11/13/23 1331 133/80 -- -- 93 -- --  11/13/23 1324 138/75 98.4 F (36.9 C) Oral 91 18 100 %    Constitutional: Well-developed, well-nourished female in no acute distress.  Cardiovascular: normal rate & rhythm, warm and well-perfused Respiratory: normal effort, no problems with respiration noted GI: Abd soft, non-tender, non-distended MSK: Extremities nontender, no edema, normal ROM Skin: warm and dry. Acyanotic, no jaundice or pallor. Neurologic: Alert and oriented x 4. No abnormal coordination. Psychiatric: Normal mood. Speech not slurred, not rapid/pressured. Patient is cooperative.  Fetal Tracing: Baseline FHR: 140 per minute Fetal heart variability: moderate Fetal Heart Rate accelerations: yes Fetal Heart Rate decelerations: none Fetal Non-stress Test: reactive reassuring  Labs: Results for orders placed or performed during the hospital encounter of 11/13/23 (from the past 24 hours)  Urinalysis, Routine w reflex microscopic -Urine, Clean Catch     Status: None   Collection Time: 11/13/23  1:18 PM  Result Value Ref Range   Color, Urine YELLOW YELLOW   APPearance CLEAR CLEAR   Specific Gravity, Urine 1.010 1.005 - 1.030   pH 7.0 5.0 - 8.0   Glucose, UA NEGATIVE NEGATIVE mg/dL   Hgb urine dipstick NEGATIVE NEGATIVE   Bilirubin Urine NEGATIVE NEGATIVE   Ketones, ur NEGATIVE NEGATIVE mg/dL   Protein, ur NEGATIVE NEGATIVE mg/dL   Nitrite NEGATIVE NEGATIVE   Leukocytes,Ua NEGATIVE NEGATIVE    Imaging:  No results found.  MDM & MAU COURSE  MDM: Low  MAU Course: -NST reassuring, patient feeling fetal movement while in MAU. -Urinalysis negative.   Orders Placed This Encounter  Procedures   Urinalysis, Routine w reflex microscopic -Urine, Clean Catch   Discharge patient   No orders of the defined types were placed in this encounter.   ASSESSMENT   1. Decreased fetal movements in third trimester, single or unspecified fetus    2. [redacted] weeks gestation of pregnancy     PLAN  Discharge home in stable condition with return precautions.  Follow up with [facility] as scheduled.  Encouraged to drink more water, discussed that dehydration can contribute to cramps/contractions.   Follow-up Information     Sue Lush, FNP Follow up.   Specialty: Obstetrics and Gynecology Why: As scheduled for ongoing prenatal care Contact information: [address] [place] [address] [phone]                 Allergies as of 11/13/2023   No Known Allergies      Medication List     STOP taking these medications    azithromycin 250 MG tablet Commonly known as: ZITHROMAX   simethicone 80 MG chewable tablet Commonly known as: MYLICON  TAKE these medications    Accu-Chek Guide w/Device Kit 1 each by Does not apply route 4 (four) times daily.   Accu-Chek Softclix Lancets lancets Use as instructed   ascorbic acid 500 MG tablet Commonly known as: VITAMIN C Take 1 tablet (500 mg total) by mouth every other day. Take with iron pill   aspirin EC 81 MG tablet Take 1 tablet (81 mg total) by mouth daily. Start taking when you are [redacted] weeks pregnant for rest of pregnancy for prevention of preeclampsia   Blood Pressure Kit Devi 1 kit by Does not apply route as needed.   cetirizine 10 MG tablet Commonly known as: ZyrTEC Allergy Take 1 tablet (10 mg total) by mouth daily.   ferrous sulfate 325 (65 FE) MG EC tablet Take 1 tablet (325 mg total) by mouth every other day. What changed: Another medication with the same name was removed. Continue taking this medication, and follow the directions you see here.   glucose blood test strip Use to check blood sugar fasting and 2 hours postprandial (after meals)   One-A-Day Womens Prenatal 1 28-0.8-235 MG Caps Take 1 capsule by mouth every evening.       Future Appointments  Date Time Provider Department Center  11/16/2023  4:10 PM Sue Lush, FNP [facility] None  11/17/2023  9:30 AM [facility] PAT 1 [facility] None     Melida Quitter, [place]

## 2023-11-14 ENCOUNTER — Encounter (HOSPITAL_COMMUNITY)
Admission: RE | Admit: 2023-11-14 | Discharge: 2023-11-14 | Disposition: A | Discharge status: Home / Self Care | Source: Ambulatory Visit | Attending: Obstetrics & Gynecology | Admitting: Obstetrics & Gynecology

## 2023-11-14 HISTORY — DX: Anemia, unspecified: D64.9

## 2023-11-14 HISTORY — DX: Benign neoplasm of connective and other soft tissue, unspecified: D21.9

## 2023-11-14 HISTORY — DX: Gestational diabetes mellitus in pregnancy, unspecified control: O24.419

## 2023-11-15 ENCOUNTER — Other Ambulatory Visit: Payer: Self-pay | Admitting: Obstetrics and Gynecology

## 2023-11-16 ENCOUNTER — Ambulatory Visit (INDEPENDENT_AMBULATORY_CARE_PROVIDER_SITE_OTHER): Admitting: Obstetrics and Gynecology

## 2023-11-16 VITALS — BP 129/86 | HR 100 | Wt 207.0 lb

## 2023-11-16 DIAGNOSIS — O24410 Gestational diabetes mellitus in pregnancy, diet controlled: Secondary | ICD-10-CM

## 2023-11-16 DIAGNOSIS — Z98891 History of uterine scar from previous surgery: Secondary | ICD-10-CM

## 2023-11-16 DIAGNOSIS — Z8759 Personal history of other complications of pregnancy, childbirth and the puerperium: Secondary | ICD-10-CM

## 2023-11-16 DIAGNOSIS — O0990 Supervision of high risk pregnancy, unspecified, unspecified trimester: Secondary | ICD-10-CM

## 2023-11-16 DIAGNOSIS — O3413 Maternal care for benign tumor of corpus uteri, third trimester: Secondary | ICD-10-CM | POA: Diagnosis not present

## 2023-11-16 DIAGNOSIS — D259 Leiomyoma of uterus, unspecified: Secondary | ICD-10-CM

## 2023-11-16 DIAGNOSIS — D573 Sickle-cell trait: Secondary | ICD-10-CM

## 2023-11-16 DIAGNOSIS — D563 Thalassemia minor: Secondary | ICD-10-CM

## 2023-11-16 DIAGNOSIS — Z3A39 39 weeks gestation of pregnancy: Secondary | ICD-10-CM

## 2023-11-16 DIAGNOSIS — O09529 Supervision of elderly multigravida, unspecified trimester: Secondary | ICD-10-CM

## 2023-11-16 NOTE — Progress Notes (Signed)
   PRENATAL VISIT NOTE  Subjective:  Caitlin Gutierrez is a 38 y.o. G2P1001 at [redacted]w[redacted]d being seen today for ongoing prenatal care.  She is currently monitored for the following issues for this high-risk pregnancy and has Anemia affecting pregnancy in third trimester; Sickle cell trait (HCC); Uterine fibroids affecting pregnancy; Alpha thalassemia silent carrier; Supervision of high risk pregnancy, antepartum; AMA (advanced maternal age) multigravida 35+, unspecified trimester; Obesity affecting pregnancy; History of gestational hypertension; H/O: C-section; and Gestational diabetes mellitus in pregnancy, diet controlled on their problem list.  Patient reports  desires SVE today .  Contractions: Irregular. Vag. Bleeding: None.  Movement: Present. Denies leaking of fluid.   The following portions of the patient's history were reviewed and updated as appropriate: allergies, current medications, past family history, past medical history, past social history, past surgical history and problem list.   Objective:   Vitals:   11/16/23 1609  BP: 129/86  Pulse: 100  Weight: 207 lb (93.9 kg)    Fetal Status: Fetal Heart Rate (bpm): 156   Movement: Present     General:  Alert, oriented and cooperative. Patient is in no acute distress.  Skin: Skin is warm and dry. No rash noted.   Cardiovascular: Normal heart rate noted  Respiratory: Normal respiratory effort, no problems with respiration noted  Abdomen: Soft, gravid, appropriate for gestational age.  Pain/Pressure: Present     Pelvic: Cervical exam performed in the presence of a chaperone Dilation: Fingertip Effacement (%): Thick    Extremities: Normal range of motion.  Edema: None  Mental Status: Normal mood and affect. Normal behavior. Normal judgment and thought content.   Assessment and Plan:  Pregnancy: G2P1001 at [redacted]w[redacted]d 1. Supervision of high risk pregnancy, antepartum (Primary) BP and FHR normal Doing well, feeling regular movement   2. H/O:  C-section Scheduled RCS 3/28, has pre op blood work scheduled tomorrow   3. Diet controlled gestational diabetes mellitus (GDM) in third trimester Controlled with diet, ran out of strips x a few days, picked them up earlier  3/13 u/s EFW 43%, AC 84%, afi 24.05  4. Leiomyoma of uterus affecting pregnancy in third trimester   5. History of gestational hypertension Normotensive on ASA  6. AMA (advanced maternal age) multigravida 35+, unspecified trimester   7. Alpha thalassemia silent carrier 8. Sickle cell trait (HCC)   9. [redacted] weeks gestation of pregnancy     Term labor symptoms and general obstetric precautions including but not limited to vaginal bleeding, contractions, leaking of fluid and fetal movement were reviewed in detail with the patient. Please refer to After Visit Summary for other counseling recommendations.   Return postpartum   Future Appointments  Date Time Provider Department Center  11/17/2023  9:30 AM [facility] PAT 1 [facility] None    Albertine Grates, FNP

## 2023-11-16 NOTE — Progress Notes (Signed)
 Wants cx check today. Denies problems.

## 2023-11-17 ENCOUNTER — Encounter (HOSPITAL_COMMUNITY)
Admission: RE | Admit: 2023-11-17 | Discharge: 2023-11-17 | Disposition: A | Discharge status: Home / Self Care | Source: Ambulatory Visit | Attending: Obstetrics and Gynecology | Admitting: Obstetrics and Gynecology

## 2023-11-17 DIAGNOSIS — Z3A39 39 weeks gestation of pregnancy: Secondary | ICD-10-CM | POA: Insufficient documentation

## 2023-11-17 DIAGNOSIS — O0993 Supervision of high risk pregnancy, unspecified, third trimester: Secondary | ICD-10-CM | POA: Insufficient documentation

## 2023-11-17 DIAGNOSIS — O0990 Supervision of high risk pregnancy, unspecified, unspecified trimester: Secondary | ICD-10-CM

## 2023-11-17 DIAGNOSIS — Z01812 Encounter for preprocedural laboratory examination: Secondary | ICD-10-CM | POA: Insufficient documentation

## 2023-11-17 LAB — CBC
HCT: 29.2 % — ABNORMAL LOW (ref 36.0–46.0)
Hemoglobin: 10.1 g/dL — ABNORMAL LOW (ref 12.0–15.0)
MCH: 26.8 pg (ref 26.0–34.0)
MCHC: 34.6 g/dL (ref 30.0–36.0)
MCV: 77.5 fL — ABNORMAL LOW (ref 80.0–100.0)
Platelets: 215 10*3/uL (ref 150–400)
RBC: 3.77 MIL/uL — ABNORMAL LOW (ref 3.87–5.11)
RDW: 14.2 % (ref 11.5–15.5)
WBC: 6.9 10*3/uL (ref 4.0–10.5)
nRBC: 0 % (ref 0.0–0.2)

## 2023-11-17 LAB — RPR: RPR Ser Ql: NONREACTIVE

## 2023-11-17 NOTE — Anesthesia Preprocedure Evaluation (Signed)
 Anesthesia Evaluation  Patient identified by MRN, date of birth, ID band Patient awake    Reviewed: Allergy & Precautions, NPO status , Patient's Chart, lab work & pertinent test results  Airway Mallampati: II  TM Distance: >3 FB Neck ROM: Full    Dental no notable dental hx. (+) Teeth Intact   Pulmonary    Pulmonary exam normal breath sounds clear to auscultation       Cardiovascular hypertension, Pt. on medications Normal cardiovascular exam Rhythm:Regular Rate:Normal     Neuro/Psych    GI/Hepatic   Endo/Other  diabetes, Gestational    Renal/GU      Musculoskeletal   Abdominal  (+) + obese (BMI 35.5)  Peds  Hematology  (+) Blood dyscrasia, Sickle cell trait Lab Results      Component                Value               Date                      WBC                      6.9                 11/17/2023                HGB                      10.1 (L)            11/17/2023                HCT                      29.2 (L)            11/17/2023                MCV                      77.5 (L)            11/17/2023                PLT                      215                 11/17/2023              Anesthesia Other Findings   Reproductive/Obstetrics (+) Pregnancy                             Anesthesia Physical Anesthesia Plan  ASA: 3  Anesthesia Plan: Spinal   Post-op Pain Management: Toradol IV (intra-op)* and Ofirmev IV (intra-op)*   Induction:   PONV Risk Score and Plan: 3 and Treatment may vary due to age or medical condition, Ondansetron and Dexamethasone  Airway Management Planned: Natural Airway and Nasal Cannula  Additional Equipment: None  Intra-op Plan:   Post-operative Plan:   Informed Consent: I have reviewed the patients History and Physical, chart, labs and discussed the procedure including the risks, benefits and alternatives for the proposed anesthesia with the  patient or authorized representative who has indicated his/her understanding and acceptance.  Dental advisory given  Plan Discussed with: CRNA and Surgeon  Anesthesia Plan Comments: (39.4 Wk G2P1 w Alpha thal, SS trait  gDM? gHtn for rpt C/S)       Anesthesia Quick Evaluation

## 2023-11-18 ENCOUNTER — Inpatient Hospital Stay (HOSPITAL_COMMUNITY)
Admission: AD | Admit: 2023-11-18 | Discharge: 2023-11-20 | DRG: 787 | Disposition: A | Discharge status: Home / Self Care | Payer: Medicaid Other | Attending: Obstetrics and Gynecology | Admitting: Obstetrics and Gynecology

## 2023-11-18 ENCOUNTER — Encounter (HOSPITAL_COMMUNITY): Payer: Self-pay | Admitting: Obstetrics and Gynecology

## 2023-11-18 ENCOUNTER — Encounter (HOSPITAL_COMMUNITY)
Admission: AD | Disposition: A | Discharge status: Home / Self Care | Payer: Self-pay | Source: Home / Self Care | Attending: Obstetrics and Gynecology

## 2023-11-18 ENCOUNTER — Ambulatory Visit (HOSPITAL_COMMUNITY): Payer: Self-pay

## 2023-11-18 ENCOUNTER — Other Ambulatory Visit: Payer: Self-pay

## 2023-11-18 DIAGNOSIS — O99013 Anemia complicating pregnancy, third trimester: Secondary | ICD-10-CM | POA: Diagnosis present

## 2023-11-18 DIAGNOSIS — D259 Leiomyoma of uterus, unspecified: Secondary | ICD-10-CM | POA: Diagnosis present

## 2023-11-18 DIAGNOSIS — D62 Acute posthemorrhagic anemia: Secondary | ICD-10-CM | POA: Diagnosis not present

## 2023-11-18 DIAGNOSIS — Z3A39 39 weeks gestation of pregnancy: Secondary | ICD-10-CM

## 2023-11-18 DIAGNOSIS — O9081 Anemia of the puerperium: Secondary | ICD-10-CM | POA: Diagnosis not present

## 2023-11-18 DIAGNOSIS — Z98891 History of uterine scar from previous surgery: Principal | ICD-10-CM

## 2023-11-18 DIAGNOSIS — O24420 Gestational diabetes mellitus in childbirth, diet controlled: Secondary | ICD-10-CM | POA: Diagnosis not present

## 2023-11-18 DIAGNOSIS — O0990 Supervision of high risk pregnancy, unspecified, unspecified trimester: Secondary | ICD-10-CM

## 2023-11-18 DIAGNOSIS — Z833 Family history of diabetes mellitus: Secondary | ICD-10-CM

## 2023-11-18 DIAGNOSIS — O99824 Streptococcus B carrier state complicating childbirth: Secondary | ICD-10-CM | POA: Diagnosis present

## 2023-11-18 DIAGNOSIS — Z8249 Family history of ischemic heart disease and other diseases of the circulatory system: Secondary | ICD-10-CM | POA: Diagnosis not present

## 2023-11-18 DIAGNOSIS — O99820 Streptococcus B carrier state complicating pregnancy: Secondary | ICD-10-CM | POA: Diagnosis not present

## 2023-11-18 DIAGNOSIS — O165 Unspecified maternal hypertension, complicating the puerperium: Secondary | ICD-10-CM | POA: Diagnosis present

## 2023-11-18 DIAGNOSIS — O3413 Maternal care for benign tumor of corpus uteri, third trimester: Secondary | ICD-10-CM | POA: Diagnosis present

## 2023-11-18 DIAGNOSIS — O99214 Obesity complicating childbirth: Secondary | ICD-10-CM | POA: Diagnosis present

## 2023-11-18 DIAGNOSIS — O34211 Maternal care for low transverse scar from previous cesarean delivery: Principal | ICD-10-CM | POA: Diagnosis present

## 2023-11-18 DIAGNOSIS — Z8759 Personal history of other complications of pregnancy, childbirth and the puerperium: Secondary | ICD-10-CM

## 2023-11-18 DIAGNOSIS — Z148 Genetic carrier of other disease: Secondary | ICD-10-CM

## 2023-11-18 DIAGNOSIS — O09523 Supervision of elderly multigravida, third trimester: Secondary | ICD-10-CM | POA: Diagnosis not present

## 2023-11-18 DIAGNOSIS — O99210 Obesity complicating pregnancy, unspecified trimester: Secondary | ICD-10-CM | POA: Diagnosis present

## 2023-11-18 DIAGNOSIS — D573 Sickle-cell trait: Secondary | ICD-10-CM | POA: Diagnosis present

## 2023-11-18 DIAGNOSIS — O09529 Supervision of elderly multigravida, unspecified trimester: Secondary | ICD-10-CM

## 2023-11-18 DIAGNOSIS — O24410 Gestational diabetes mellitus in pregnancy, diet controlled: Secondary | ICD-10-CM | POA: Diagnosis present

## 2023-11-18 LAB — CBC
HCT: 30.6 % — ABNORMAL LOW (ref 36.0–46.0)
HCT: 29.9 % — ABNORMAL LOW (ref 36.0–46.0)
Hemoglobin: 10.4 g/dL — ABNORMAL LOW (ref 12.0–15.0)
Hemoglobin: 10.3 g/dL — ABNORMAL LOW (ref 12.0–15.0)
MCH: 27.1 pg (ref 26.0–34.0)
MCH: 27.1 pg (ref 26.0–34.0)
MCHC: 34 g/dL (ref 30.0–36.0)
MCHC: 34.4 g/dL (ref 30.0–36.0)
MCV: 79.7 fL — ABNORMAL LOW (ref 80.0–100.0)
MCV: 78.7 fL — ABNORMAL LOW (ref 80.0–100.0)
Platelets: 184 10*3/uL (ref 150–400)
Platelets: 187 10*3/uL (ref 150–400)
RBC: 3.84 MIL/uL — ABNORMAL LOW (ref 3.87–5.11)
RBC: 3.8 MIL/uL — ABNORMAL LOW (ref 3.87–5.11)
RDW: 14.5 % (ref 11.5–15.5)
RDW: 14.2 % (ref 11.5–15.5)
WBC: 9.3 10*3/uL (ref 4.0–10.5)
WBC: 14.6 10*3/uL — ABNORMAL HIGH (ref 4.0–10.5)
nRBC: 0 % (ref 0.0–0.2)
nRBC: 0 % (ref 0.0–0.2)

## 2023-11-18 LAB — DIC (DISSEMINATED INTRAVASCULAR COAGULATION)PANEL
D-Dimer, Quant: 5.67 ug{FEU}/mL — ABNORMAL HIGH (ref 0.00–0.50)
Fibrinogen: 431 mg/dL (ref 210–475)
INR: 1 (ref 0.8–1.2)
Platelets: 182 10*3/uL (ref 150–400)
Prothrombin Time: 13.6 s (ref 11.4–15.2)
Smear Review: NONE SEEN
aPTT: 28 s (ref 24–36)

## 2023-11-18 LAB — GLUCOSE, CAPILLARY
Glucose-Capillary: 70 mg/dL (ref 70–99)
Glucose-Capillary: 81 mg/dL (ref 70–99)

## 2023-11-18 LAB — PREPARE RBC (CROSSMATCH)

## 2023-11-18 SURGERY — Surgical Case
Anesthesia: Spinal

## 2023-11-18 MED ORDER — SODIUM CHLORIDE 0.9% IV SOLUTION: Freq: Once | INTRAVENOUS | Status: DC

## 2023-11-18 MED ORDER — SODIUM CHLORIDE 0.9% FLUSH: 3.0000 mL | INTRAVENOUS | Status: DC | PRN

## 2023-11-18 MED ORDER — SIMETHICONE 80 MG PO CHEW
80.0000 mg | CHEWABLE_TABLET | Freq: Three times a day (TID) | ORAL | Status: DC
  Administered 2023-11-19 – 2023-11-20 (×4): 80 mg via ORAL
  Filled 2023-11-18 (×4): qty 1

## 2023-11-18 MED ORDER — DIPHENHYDRAMINE HCL 25 MG PO CAPS: 25.0000 mg | ORAL_CAPSULE | ORAL | Status: DC | PRN

## 2023-11-18 MED ORDER — COCONUT OIL OIL: 1.0000 | TOPICAL_OIL | Status: DC | PRN

## 2023-11-18 MED ORDER — SCOPOLAMINE 1 MG/3DAYS TD PT72
1.0000 | MEDICATED_PATCH | Freq: Once | TRANSDERMAL | Status: DC
  Administered 2023-11-18: 1.5 mg via TRANSDERMAL

## 2023-11-18 MED ORDER — LACTATED RINGERS IV BOLUS: 1000.0000 mL | Freq: Once | INTRAVENOUS | Status: DC

## 2023-11-18 MED ORDER — PRENATAL MULTIVITAMIN CH
1.0000 | ORAL_TABLET | Freq: Every day | ORAL | Status: DC
  Administered 2023-11-19 – 2023-11-20 (×2): 1 via ORAL
  Filled 2023-11-18 (×2): qty 1

## 2023-11-18 MED ORDER — LACTATED RINGERS IV SOLN: INTRAVENOUS | Status: DC | PRN

## 2023-11-18 MED ORDER — MENTHOL 3 MG MT LOZG: 1.0000 | LOZENGE | OROMUCOSAL | Status: DC | PRN

## 2023-11-18 MED ORDER — CEFAZOLIN SODIUM-DEXTROSE 2-4 GM/100ML-% IV SOLN
2.0000 g | INTRAVENOUS | Status: AC
  Administered 2023-11-18 (×2): 2 g via INTRAVENOUS

## 2023-11-18 MED ORDER — OXYTOCIN-SODIUM CHLORIDE 30-0.9 UT/500ML-% IV SOLN
INTRAVENOUS | Status: DC | PRN
  Administered 2023-11-18: 30 [IU] via INTRAVENOUS

## 2023-11-18 MED ORDER — POVIDONE-IODINE 10 % EX SWAB: 2.0000 | Freq: Once | CUTANEOUS | Status: DC

## 2023-11-18 MED ORDER — NALOXONE HCL 4 MG/10ML IJ SOLN: 1.0000 ug/kg/h | INTRAVENOUS | Status: DC | PRN

## 2023-11-18 MED ORDER — SCOPOLAMINE 1 MG/3DAYS TD PT72
MEDICATED_PATCH | TRANSDERMAL | Status: AC
  Filled 2023-11-18: qty 1

## 2023-11-18 MED ORDER — DEXAMETHASONE SODIUM PHOSPHATE 10 MG/ML IJ SOLN
INTRAMUSCULAR | Status: AC
  Filled 2023-11-18: qty 1

## 2023-11-18 MED ORDER — PHENYLEPHRINE HCL-NACL 20-0.9 MG/250ML-% IV SOLN
INTRAVENOUS | Status: AC
Start: 2023-11-18 — End: ?
  Filled 2023-11-18: qty 250

## 2023-11-18 MED ORDER — ONDANSETRON HCL 4 MG/2ML IJ SOLN
INTRAMUSCULAR | Status: AC
  Filled 2023-11-18: qty 2

## 2023-11-18 MED ORDER — MORPHINE SULFATE (PF) 0.5 MG/ML IJ SOLN
INTRAMUSCULAR | Status: DC | PRN
  Administered 2023-11-18: 150 ug via INTRATHECAL

## 2023-11-18 MED ORDER — CEFAZOLIN SODIUM-DEXTROSE 2-4 GM/100ML-% IV SOLN
INTRAVENOUS | Status: AC
  Filled 2023-11-18: qty 100

## 2023-11-18 MED ORDER — NIFEDIPINE ER OSMOTIC RELEASE 30 MG PO TB24
30.0000 mg | ORAL_TABLET | Freq: Once | ORAL | Status: AC
  Administered 2023-11-18: 30 mg via ORAL
  Filled 2023-11-18: qty 1

## 2023-11-18 MED ORDER — OXYTOCIN-SODIUM CHLORIDE 30-0.9 UT/500ML-% IV SOLN
2.5000 [IU]/h | INTRAVENOUS | Status: AC
  Administered 2023-11-18: 2.5 [IU]/h via INTRAVENOUS
  Filled 2023-11-18: qty 500

## 2023-11-18 MED ORDER — HYDROMORPHONE HCL 1 MG/ML IJ SOLN: 0.2500 mg | INTRAMUSCULAR | Status: DC | PRN

## 2023-11-18 MED ORDER — NALOXONE HCL 0.4 MG/ML IJ SOLN: 0.4000 mg | INTRAMUSCULAR | Status: DC | PRN

## 2023-11-18 MED ORDER — DEXAMETHASONE SODIUM PHOSPHATE 10 MG/ML IJ SOLN
INTRAMUSCULAR | Status: DC | PRN
  Administered 2023-11-18: 10 mg via INTRAVENOUS

## 2023-11-18 MED ORDER — OXYCODONE HCL 5 MG/5ML PO SOLN: 5.0000 mg | Freq: Once | ORAL | Status: DC | PRN

## 2023-11-18 MED ORDER — SENNOSIDES-DOCUSATE SODIUM 8.6-50 MG PO TABS
2.0000 | ORAL_TABLET | ORAL | Status: DC
  Administered 2023-11-19: 2 via ORAL
  Filled 2023-11-18: qty 2

## 2023-11-18 MED ORDER — OXYTOCIN-SODIUM CHLORIDE 30-0.9 UT/500ML-% IV SOLN
INTRAVENOUS | Status: AC
  Filled 2023-11-18: qty 500

## 2023-11-18 MED ORDER — POTASSIUM CHLORIDE CRYS ER 20 MEQ PO TBCR
40.0000 meq | EXTENDED_RELEASE_TABLET | Freq: Every day | ORAL | Status: DC
  Administered 2023-11-19 – 2023-11-20 (×2): 40 meq via ORAL
  Filled 2023-11-18 (×2): qty 2

## 2023-11-18 MED ORDER — STERILE WATER FOR IRRIGATION IR SOLN
Status: DC | PRN
  Administered 2023-11-18: 1

## 2023-11-18 MED ORDER — DIPHENHYDRAMINE HCL 50 MG/ML IJ SOLN: 12.5000 mg | INTRAMUSCULAR | Status: DC | PRN

## 2023-11-18 MED ORDER — FENTANYL CITRATE (PF) 100 MCG/2ML IJ SOLN
INTRAMUSCULAR | Status: AC
  Filled 2023-11-18: qty 2

## 2023-11-18 MED ORDER — FUROSEMIDE 20 MG PO TABS
40.0000 mg | ORAL_TABLET | Freq: Every day | ORAL | Status: DC
  Administered 2023-11-19 – 2023-11-20 (×2): 40 mg via ORAL
  Filled 2023-11-18 (×2): qty 2

## 2023-11-18 MED ORDER — DIBUCAINE (PERIANAL) 1 % EX OINT: 1.0000 | TOPICAL_OINTMENT | CUTANEOUS | Status: DC | PRN

## 2023-11-18 MED ORDER — IBUPROFEN 600 MG PO TABS
600.0000 mg | ORAL_TABLET | Freq: Four times a day (QID) | ORAL | Status: DC
  Administered 2023-11-18 – 2023-11-20 (×7): 600 mg via ORAL
  Filled 2023-11-18 (×7): qty 1

## 2023-11-18 MED ORDER — OXYCODONE HCL 5 MG PO TABS
5.0000 mg | ORAL_TABLET | ORAL | Status: DC | PRN
  Administered 2023-11-19 – 2023-11-20 (×3): 5 mg via ORAL
  Filled 2023-11-18 (×3): qty 1

## 2023-11-18 MED ORDER — TRANEXAMIC ACID-NACL 1000-0.7 MG/100ML-% IV SOLN
INTRAVENOUS | Status: AC
  Filled 2023-11-18: qty 100

## 2023-11-18 MED ORDER — WITCH HAZEL-GLYCERIN EX PADS: 1.0000 | MEDICATED_PAD | CUTANEOUS | Status: DC | PRN

## 2023-11-18 MED ORDER — LACTATED RINGERS IV SOLN: INTRAVENOUS | Status: DC

## 2023-11-18 MED ORDER — FENTANYL CITRATE (PF) 100 MCG/2ML IJ SOLN
INTRAMUSCULAR | Status: DC | PRN
  Administered 2023-11-18: 15 ug via INTRATHECAL

## 2023-11-18 MED ORDER — ACETAMINOPHEN 500 MG PO TABS
1000.0000 mg | ORAL_TABLET | Freq: Four times a day (QID) | ORAL | Status: DC
  Administered 2023-11-18 – 2023-11-20 (×8): 1000 mg via ORAL
  Filled 2023-11-18 (×8): qty 2

## 2023-11-18 MED ORDER — OXYCODONE HCL 5 MG PO TABS: 5.0000 mg | ORAL_TABLET | Freq: Once | ORAL | Status: DC | PRN

## 2023-11-18 MED ORDER — MEASLES, MUMPS & RUBELLA VAC IJ SOLR: 0.5000 mL | Freq: Once | INTRAMUSCULAR | Status: DC

## 2023-11-18 MED ORDER — SODIUM CHLORIDE 0.9 % IV SOLN: INTRAVENOUS | Status: DC | PRN

## 2023-11-18 MED ORDER — KETOROLAC TROMETHAMINE 30 MG/ML IJ SOLN: 30.0000 mg | Freq: Once | INTRAMUSCULAR | Status: DC | PRN

## 2023-11-18 MED ORDER — METHYLERGONOVINE MALEATE 0.2 MG/ML IJ SOLN
INTRAMUSCULAR | Status: DC | PRN
  Administered 2023-11-18: .2 mg via INTRAMUSCULAR

## 2023-11-18 MED ORDER — DIPHENHYDRAMINE HCL 25 MG PO CAPS
25.0000 mg | ORAL_CAPSULE | Freq: Four times a day (QID) | ORAL | Status: DC | PRN
  Administered 2023-11-18 – 2023-11-19 (×2): 25 mg via ORAL
  Filled 2023-11-18 (×2): qty 1

## 2023-11-18 MED ORDER — MORPHINE SULFATE (PF) 0.5 MG/ML IJ SOLN
INTRAMUSCULAR | Status: AC
  Filled 2023-11-18: qty 10

## 2023-11-18 MED ORDER — ONDANSETRON HCL 4 MG/2ML IJ SOLN: 4.0000 mg | Freq: Once | INTRAMUSCULAR | Status: DC | PRN

## 2023-11-18 MED ORDER — METHYLERGONOVINE MALEATE 0.2 MG/ML IJ SOLN
INTRAMUSCULAR | Status: AC
  Filled 2023-11-18: qty 1

## 2023-11-18 MED ORDER — SODIUM CHLORIDE 0.9 % IR SOLN
Status: DC | PRN
  Administered 2023-11-18: 1

## 2023-11-18 MED ORDER — SIMETHICONE 80 MG PO CHEW: 80.0000 mg | CHEWABLE_TABLET | ORAL | Status: DC | PRN

## 2023-11-18 MED ORDER — TRANEXAMIC ACID-NACL 1000-0.7 MG/100ML-% IV SOLN
INTRAVENOUS | Status: DC | PRN
Start: 2023-11-18 — End: 2023-11-18
  Administered 2023-11-18: 1000 mg via INTRAVENOUS

## 2023-11-18 MED ORDER — MEPERIDINE HCL 25 MG/ML IJ SOLN: 6.2500 mg | INTRAMUSCULAR | Status: DC | PRN

## 2023-11-18 MED ORDER — ONDANSETRON HCL 4 MG/2ML IJ SOLN: 4.0000 mg | Freq: Three times a day (TID) | INTRAMUSCULAR | Status: DC | PRN

## 2023-11-18 MED ORDER — ZOLPIDEM TARTRATE 5 MG PO TABS: 5.0000 mg | ORAL_TABLET | Freq: Every evening | ORAL | Status: DC | PRN

## 2023-11-18 MED ORDER — ONDANSETRON HCL 4 MG/2ML IJ SOLN
INTRAMUSCULAR | Status: DC | PRN
  Administered 2023-11-18: 4 mg via INTRAVENOUS

## 2023-11-18 MED ORDER — BUPIVACAINE IN DEXTROSE 0.75-8.25 % IT SOLN
INTRATHECAL | Status: DC | PRN
  Administered 2023-11-18: 12 mg via INTRATHECAL

## 2023-11-18 MED ORDER — ENOXAPARIN SODIUM 60 MG/0.6ML IJ SOSY
50.0000 mg | PREFILLED_SYRINGE | INTRAMUSCULAR | Status: DC
  Administered 2023-11-19 – 2023-11-20 (×2): 50 mg via SUBCUTANEOUS
  Filled 2023-11-18 (×2): qty 0.6

## 2023-11-18 MED ORDER — PHENYLEPHRINE HCL-NACL 20-0.9 MG/250ML-% IV SOLN
INTRAVENOUS | Status: DC | PRN
  Administered 2023-11-18: 60 ug/min via INTRAVENOUS

## 2023-11-18 SURGICAL SUPPLY — 34 items
BAG COUNTER SPONGE SURGICOUNT (BAG) ×1 IMPLANT
BENZOIN TINCTURE PRP APPL 2/3 (GAUZE/BANDAGES/DRESSINGS) ×1 IMPLANT
CHLORAPREP W/TINT 26 (MISCELLANEOUS) ×1 IMPLANT
CLAMP CORD UMBIL (MISCELLANEOUS) IMPLANT
DRSG OPSITE POSTOP 4X10 (GAUZE/BANDAGES/DRESSINGS) ×1 IMPLANT
ELECT REM PT RETURN 9FT ADLT (ELECTROSURGICAL) ×1 IMPLANT
ELECTRODE REM PT RTRN 9FT ADLT (ELECTROSURGICAL) ×1 IMPLANT
EXTRACTOR VACUUM M CUP 4 TUBE (SUCTIONS) IMPLANT
GAUZE PAD ABD 7.5X8 STRL (GAUZE/BANDAGES/DRESSINGS) IMPLANT
GLOVE BIOGEL PI IND STRL 7.0 (GLOVE) ×2 IMPLANT
GLOVE BIOGEL PI IND STRL 7.5 (GLOVE) ×2 IMPLANT
GLOVE ECLIPSE 7.5 STRL STRAW (GLOVE) ×1 IMPLANT
GOWN STRL REUS W/ TWL LRG LVL3 (GOWN DISPOSABLE) ×3 IMPLANT
HEMOSTAT ARISTA ABSORB 3G PWDR (HEMOSTASIS) IMPLANT
KIT ABG SYR 3ML LUER SLIP (SYRINGE) IMPLANT
KIT TURNOVER KIT B (KITS) ×1 IMPLANT
MAT PREVALON FULL STRYKER (MISCELLANEOUS) IMPLANT
NDL HYPO 25X5/8 SAFETYGLIDE (NEEDLE) IMPLANT
NEEDLE HYPO 25X5/8 SAFETYGLIDE (NEEDLE) IMPLANT
NS IRRIG 1000ML POUR BTL (IV SOLUTION) ×1 IMPLANT
PACK C SECTION WH (CUSTOM PROCEDURE TRAY) ×1 IMPLANT
PAD ABD DERMACEA PRESS 5X9 (GAUZE/BANDAGES/DRESSINGS) IMPLANT
PAD OB MATERNITY 11 LF (PERSONAL CARE ITEMS) ×1 IMPLANT
PENCIL SMOKE EVACUATOR (MISCELLANEOUS) ×1 IMPLANT
RTRCTR C-SECT PINK 25CM LRG (MISCELLANEOUS) ×1 IMPLANT
STRIP CLOSURE SKIN 1/2X4 (GAUZE/BANDAGES/DRESSINGS) ×1 IMPLANT
SUT VIC AB 0 CT1 36 (SUTURE) ×1 IMPLANT
SUT VIC AB 0 CTX36XBRD ANBCTRL (SUTURE) ×2 IMPLANT
SUT VIC AB 2-0 CT1 TAPERPNT 27 (SUTURE) ×1 IMPLANT
SUT VIC AB 4-0 KS 27 (SUTURE) ×1 IMPLANT
SUTURE PLAIN GUT 2.0 ETHICON (SUTURE) IMPLANT
TOWEL GREEN STERILE FF (TOWEL DISPOSABLE) ×1 IMPLANT
TRAY FOLEY W/BAG SLVR 14FR (SET/KITS/TRAYS/PACK) ×1 IMPLANT
WATER STERILE IRR 1000ML POUR (IV SOLUTION) ×1 IMPLANT

## 2023-11-18 NOTE — Anesthesia Procedure Notes (Signed)
 Spinal  Patient location during procedure: OB Start time: 11/18/2023 10:08 AM End time: 11/18/2023 10:13 AM Reason for block: surgical anesthesia Staffing Performed: anesthesiologist  Anesthesiologist: Trevor Iha, MD Performed by: Trevor Iha, MD Authorized by: Trevor Iha, MD   Preanesthetic Checklist Completed: patient identified, IV checked, risks and benefits discussed, surgical consent, monitors and equipment checked, pre-op evaluation and timeout performed Spinal Block Patient position: sitting Prep: DuraPrep and site prepped and draped Patient monitoring: heart rate, cardiac monitor, continuous pulse ox and blood pressure Approach: midline Location: L3-4 Injection technique: single-shot Needle Needle type: Pencan  Needle gauge: 24 G Needle length: 10 cm Needle insertion depth: 6 cm Assessment Sensory level: T4 Events: CSF return Additional Notes  1 Attempt (s). Pt tolerated procedure well.

## 2023-11-18 NOTE — Transfer of Care (Signed)
 Immediate Anesthesia Transfer of Care Note  Patient: Caitlin Gutierrez  Procedure(s) Performed: CESAREAN DELIVERY  Patient Location: PACU  Anesthesia Type:Spinal  Level of Consciousness: awake, alert , and oriented  Airway & Oxygen Therapy: Patient Spontanous Breathing  Post-op Assessment: Report given to RN and Post -op Vital signs reviewed and stable  Post vital signs: Reviewed and stable  Last Vitals:  Vitals Value Taken Time  BP 129/79 11/18/23 1146  Temp    Pulse    Resp 26 11/18/23 1150  SpO2    Vitals shown include unfiled device data.  Last Pain:  Vitals:   11/18/23 0821  TempSrc: Oral         Complications: No notable events documented.

## 2023-11-18 NOTE — Op Note (Signed)
 Cesarean Section Operative Report  Caitlin Gutierrez  11/18/2023  Indications: Scheduled Proceedure/Maternal Request   Pre-operative Diagnosis: repeat low transverse cesarean section  Post-operative Diagnosis: repeat low transverse cesarean section, immediate post-partum hemorrhage  Surgeon: Surgeons and Role:    * Jerelyn Trimarco, Wilfred Curtis, MD - Primary    * Myna Hidalgo, DO - Assisting    * Joanne Gavel, MD - Assisting   Attending Attestation: I was present and scrubbed for the entire procedure.   An experienced assistant was required given the standard of surgical care given the complexity of the case.  This assistant was needed for exposure, dissection, suctioning, retraction, instrument exchange, assisting with delivery with administration of fundal pressure, and for overall help during the procedure.  Anesthesia: epidural    Quantified Blood Loss: 1940 ml  Total IV Fluids: 1250 ml LR  Urine Output:: 125 ml clear yellow urine  Specimens: none  Findings: Viable female infant in cephalic presentation; Apgars pending; weight pending; arterial cord pH not obtained;  clear amniotic fluid; intact placenta with three vessel cord; normal uterus, fallopian tubes and ovaries bilaterally.  Baby condition / location:  Couplet care / Skin to Skin   Complications: Immediate postpartum hemorrhage (secondary to left lateral extension). Moderate adhesive disease anterior to the rectus and anterior to the uterus.   Indications: Caitlin Gutierrez is a 38 y.o. G2P1001 with an IUP [redacted]w[redacted]d presenting for scheduled elective repeat cesarean section. She declined offer to [facility].  The risks, benefits, complications, treatment options, and exected outcomes were discussed with the patient . The patient dwith the proposed plan, giving informed consent. identified as Caitlin Gutierrez and the procedure verified as C-Section Delivery.  Procedure Details:  The patient was taken back to the operative suite where  spinal anesthesia was placed.  A time out was held and the above information confirmed.   After induction of anesthesia, the patient was draped and prepped in the usual sterile manner and placed in a dorsal supine position with a leftward tilt. A Pfannenstiel incision was made and carried down through the subcutaneous tissue to the fascia. Fascial incision was made and sharply extended transversely. The fascia was separated from the underlying rectus tissue superiorly and inferiorly. The peritoneum was identified and sharply entered and extended longitudinally. Alexis retractor was placed. A bladder flap was created. A low transverse uterine incision was made and extended bluntly. Delivered from cephalic presentation with vacuum assistance (one pull, no pop-offs) was a viable infant with Apgars and weight as above.  After waiting 60 seconds for delayed cord cutting, the umbilical cord was clamped and cut cord blood was obtained for evaluation. Cord ph was not sent. The placenta was removed Intact and appeared normal.   At this time greater than average bleeding was noted coming from the left margin of the hysterotomy. Clamps were placed, TXA and methergine ordered, and extra assistance (Dr. Charlotta Newton) was called. 2 units of blood ordered when EBL reached ~1600 ml. The uterine incision was closed with running locked sutures of 0 vicryl in one layer. A figure of eight 0 vicryl stitch was placed at the left inferior margin to control arterial bleeding at that site. An additional 0 vicryl stitch was placed over the midline of the hysterotomy in mattress fashion to achieve hemostasis. Arista placed, abdomen was irrigated. Bleeding controlled to Dr. Lawana Chambers satisfaction and she scrubbed out. The rectus muscles were examined and hemostasis observed after Bovie cautery. The fascia was then reapproximated with running sutures of 0-Vicryl. The  subcuticular closure was performed with 2-0 plain gut. The skin was closed with 4-0  Vicryl.  Instrument, sponge, and needle counts were correct prior the abdominal closure and were correct at the conclusion of the case.     Disposition: PACU - hemodynamically stable.   Maternal Condition: stable       Signed: Silvano Bilis, MD 11/18/2023 11:28 AM

## 2023-11-18 NOTE — Progress Notes (Signed)
 Pt last two blood pressures 139/80 and 147/84. Pt has no signs/symptoms of preeclampsia. Dr Earlene Plater notified. No new orders.

## 2023-11-18 NOTE — Discharge Summary (Signed)
 Postpartum Discharge Summary  Date of Service updated - 11/20/23      Patient Name: Caitlin Gutierrez DOB: 02-07-1986 MRN: [id number]  Date of admission: 11/18/2023 Delivery date:11/18/2023 Delivering provider: Shonna Chock [place] Date of discharge: 11/20/2023  Admitting diagnosis: Labor and delivery, indication for care [O75.9] Intrauterine pregnancy: [redacted]w[redacted]d     Secondary diagnosis:  Active Problems:   Anemia affecting pregnancy in third trimester   Sickle cell trait (HCC)   Acute blood loss anemia   Supervision of high risk pregnancy, antepartum   AMA (advanced maternal age) multigravida 35+, unspecified trimester   Obesity affecting pregnancy   Postpartum hypertension   H/O: C-section   Gestational diabetes mellitus in pregnancy, diet controlled   Labor and delivery, indication for care   Postpartum hemorrhage  Additional problems: None    Discharge diagnosis: Term Pregnancy Delivered, Anemia, and PPH                                              Post partum procedures: blood transfusion started intra-operatively Augmentation: N/A Complications: Hemorrhage>1024mL  Hospital course: 38 y.o. yo G2P2002 at [redacted]w[redacted]d was admitted to the hospital 11/18/2023 for scheduled cesarean section with the following indication: Elective Repeat. Delivery details are as follows:  Membrane Rupture Time/Date: 10:44 AM,11/18/2023  Delivery Method:C-Section, Vacuum Assisted Operative Delivery:  Bell used for difficulty with head extraction Details of operation can be found in separate operative note.  Patient had a postpartum course complicated by nothing.  She is ambulating, tolerating a regular diet, passing flatus, and urinating well. Patient is discharged home in stable condition on  11/20/23        Newborn Data: Birth date:11/18/2023 Birth time:10:45 AM Gender:Female Living status:Living Apgars:9 ,9  Weight:3490 g    Magnesium Sulfate received: No BMZ received:  No Rhophylac:No MMR:No T-DaP:Given prenatally Flu: Yes RSV Vaccine received: No Transfusion:Yes  Immunizations received: Immunization History  Administered Date(s) Administered   Influenza, Seasonal, Injecte, Preservative Fre 06/15/2023   PFIZER(Purple Top)SARS-COV-2 Vaccination 09/23/2018, 11/22/2019   Tdap 06/02/2021, 09/22/2023    Physical exam  Vitals:   11/19/23 0431 11/19/23 1500 11/19/23 2046 11/20/23 0510  BP: 122/78 109/71 111/72 117/65  Pulse: 78 82 79 80  Resp: 18 18 17 18   Temp:  98.2 F (36.8 C) 98.4 F (36.9 C) 98.1 F (36.7 C)  TempSrc:  Oral Oral Oral  SpO2:   100% 99%  Weight:      Height:       General: alert, cooperative, and no distress Lochia: appropriate Uterine Fundus: firm Incision: Dressing is clean, dry, and intact DVT Evaluation: No significant calf/ankle edema. Labs: Lab Results  Component Value Date   WBC 13.3 (H) 11/19/2023   HGB 9.4 (L) 11/19/2023   HCT 26.7 (L) 11/19/2023   MCV 78.1 (L) 11/19/2023   PLT 181 11/19/2023      Latest Ref Rng & Units 11/19/2023   11:17 AM  CMP  Glucose 70 - 99 mg/dL 784   BUN 6 - 20 mg/dL <5   Creatinine 6.96 - 1.00 mg/dL 2.95   Sodium 284 - 132 mmol/L 136   Potassium 3.5 - 5.1 mmol/L 3.8   Chloride 98 - 111 mmol/L 105   CO2 22 - 32 mmol/L 22   Calcium 8.9 - 10.3 mg/dL 9.2   Total Protein 6.5 - 8.1 g/dL 5.7  Total Bilirubin 0.0 - 1.2 mg/dL 0.6   Alkaline Phos 38 - 126 U/L 94   AST 15 - 41 U/L 26   ALT 0 - 44 U/L 11    Edinburgh Score:    11/19/2023   12:10 AM  Edinburgh Postnatal Depression Scale Screening Tool  I have been able to laugh and see the funny side of things. 0  I have looked forward with enjoyment to things. 0  I have blamed myself unnecessarily when things went wrong. 0  I have been anxious or worried for no good reason. 1  I have felt scared or panicky for no good reason. 0  Things have been getting on top of me. 0  I have been so unhappy that I have had difficulty  sleeping. 0  I have felt sad or miserable. 0  I have been so unhappy that I have been crying. 0  The thought of harming myself has occurred to me. 0  Edinburgh Postnatal Depression Scale Total 1   Edinburgh Postnatal Depression Scale Total: 1   After visit meds:  Allergies as of 11/20/2023   No Known Allergies      Medication List     STOP taking these medications    Accu-Chek Guide w/Device Kit   Accu-Chek Softclix Lancets lancets   ascorbic acid 500 MG tablet Commonly known as: VITAMIN C   aspirin EC 81 MG tablet   Blood Pressure Kit Devi   glucose blood test strip       TAKE these medications    cetirizine 10 MG tablet Commonly known as: ZyrTEC Allergy Take 1 tablet (10 mg total) by mouth daily.   ferrous sulfate 325 (65 FE) MG EC tablet Take 1 tablet (325 mg total) by mouth every other day.   furosemide 40 MG tablet Commonly known as: LASIX Take 1 tablet (40 mg total) by mouth daily.   ibuprofen 600 MG tablet Commonly known as: ADVIL Take 1 tablet (600 mg total) by mouth every 6 (six) hours.   One-A-Day Womens Prenatal 1 28-0.8-235 MG Caps Take 1 capsule by mouth every evening.   oxyCODONE 5 MG immediate release tablet Commonly known as: Oxy IR/ROXICODONE Take 1 tablet (5 mg total) by mouth every 6 (six) hours as needed for severe pain (pain score 7-10) or breakthrough pain.   potassium chloride SA 20 MEQ tablet Commonly known as: KLOR-CON M Take 2 tablets (40 mEq total) by mouth daily.   senna-docusate 8.6-50 MG tablet Commonly known as: Senokot-S Take 2 tablets by mouth daily.         Discharge home in stable condition Infant Feeding:  Formula Infant Disposition:home with mother Discharge instruction: per After Visit Summary and Postpartum booklet. Activity: Advance as tolerated. Pelvic rest for 6 weeks.  Diet: carb modified diet Future Appointments: Future Appointments  Date Time Provider Department Center  11/28/2023 10:00 AM  [facility] NURSE [facility] None  12/30/2023  9:15 AM Adam Phenix, MD [facility] None   Follow up Visit:  Follow-up Information     [facility] Follow up.   Specialty: Pediatrics Contact information: [address] [place] [address] [phone]               Message sent to [facility] 3/28  Please schedule this patient for a In person postpartum visit in 6 weeks with the following provider: Any provider. Additional Postpartum F/U:Incision check 1 week and 2h GTT  High risk pregnancy complicated by:  A1GDM, AMA Delivery mode:  C-Section, Vacuum Assisted Anticipated Birth Control:  IUD   11/20/2023 Sundra Aland, MD

## 2023-11-18 NOTE — Lactation Note (Signed)
 This note was copied from a baby's chart. Lactation Consultation Note  Patient Name: Caitlin Gutierrez [id number] Date: 11/18/2023 Age:37 hours Reason for consult: Initial assessment;Term;Maternal endocrine disorder  P2- MOB plans to offer both breast milk and formula. MOB reports that feedings have been going well. MOB denies having any questions or concerns at this time. LC reviewed the first 24 hr birthday nap, day 2 cluster feeding, feeding infant on cue 8-12x in 24 hrs, not allowing infant to go over 3 hrs without a feeding, CDC milk storage guidelines, LC services handout and engorgement/breast care. LC encouraged MOB to call for further assistance as needed.  Maternal Data Has patient been taught Hand Expression?: No Does the patient have breastfeeding experience prior to this delivery?: Yes How long did the patient breastfeed?: 1.4 hrs  Feeding Mother's Current Feeding Choice: Breast Milk and Formula Nipple Type: Slow - flow  Lactation Tools Discussed/Used Pump Education: Milk Storage  Interventions Interventions: Breast feeding basics reviewed;Education;LC Services brochure  Discharge Discharge Education: Engorgement and breast care;Warning signs for feeding baby Pump: DEBP;Personal  Consult Status Consult Status: Follow-up Date: 11/19/23 Follow-up type: In-patient    Dema Severin BS, IBCLC 11/18/2023, 4:23 PM

## 2023-11-18 NOTE — Anesthesia Postprocedure Evaluation (Signed)
 Anesthesia Post Note  Patient: Caitlin Gutierrez  Procedure(s) Performed: CESAREAN DELIVERY     Patient location during evaluation: Mother Baby Anesthesia Type: Spinal Level of consciousness: oriented and awake and alert Pain management: pain level controlled Vital Signs Assessment: post-procedure vital signs reviewed and stable Respiratory status: spontaneous breathing and respiratory function stable Cardiovascular status: blood pressure returned to baseline and stable Postop Assessment: no headache, no backache, no apparent nausea or vomiting and able to ambulate Anesthetic complications: no  No notable events documented.  Last Vitals:  Vitals:   11/18/23 1315 11/18/23 1330  BP: 133/79 127/79  Pulse: 79 78  Resp: 15 18  Temp:  36.7 C  SpO2: 97% 99%    Last Pain:  Vitals:   11/18/23 1330  TempSrc: Oral  PainSc: 0-No pain   Pain Goal:    LLE Motor Response: Purposeful movement (11/18/23 1315) LLE Sensation: Tingling (11/18/23 1315) RLE Motor Response: Purposeful movement (11/18/23 1315) RLE Sensation: Tingling (11/18/23 1315)     Epidural/Spinal Function Cutaneous sensation: Pins and Needles (11/18/23 1330), Patient able to flex knees: Yes (11/18/23 1330), Patient able to lift hips off bed: No (11/18/23 1330), Back pain beyond tenderness at insertion site: No (11/18/23 1330), Progressively worsening motor and/or sensory loss: No (11/18/23 1330), Bowel and/or bladder incontinence post epidural: No (11/18/23 1330)  Trevor Iha

## 2023-11-18 NOTE — H&P (Signed)
 LABOR AND DELIVERY ADMISSION HISTORY AND PHYSICAL NOTE  Caitlin Gutierrez is a 38 y.o. female G2P1001 with IUP at [redacted]w[redacted]d by L/11 presenting for scheduled repeat cesarean section.   She reports positive fetal movement. She denies leakage of fluid or vaginal bleeding.  Prenatal History/Complications:  Past Medical History: Past Medical History:  Diagnosis Date   Alpha thalassemia silent carrier 07/10/2021   Anemia    Fibroid    Gestational diabetes    Gestational hypertension 11/10/2021   H/O migraine with aura 12/31/2021    Past Surgical History: Past Surgical History:  Procedure Laterality Date   CESAREAN SECTION N/A 11/10/2021   Procedure: CESAREAN SECTION;  Surgeon: Levie Heritage, DO;  Location: [facility] LD ORS;  Service: Obstetrics;  Laterality: N/A;    Obstetrical History: OB History     Gravida  2   Para  1   Term  1   Preterm  0   AB  0   Living  1      SAB  0   IAB  0   Ectopic  0   Multiple  0   Live Births  1           Social History: Social History   Socioeconomic History   Marital status: Married    Spouse name: Not on file   Number of children: Not on file   Years of education: Not on file   Highest education level: Not on file  Occupational History   Not on file  Tobacco Use   Smoking status: Never   Smokeless tobacco: Never  Vaping Use   Vaping status: Never Used  Substance and Sexual Activity   Alcohol use: Not Currently   Drug use: Never   Sexual activity: Not Currently    Partners: Male    Birth control/protection: None  Other Topics Concern   Not on file  Social History Narrative   Not on file   Social Drivers of Health   Financial Resource Strain: Not on file  Food Insecurity: No Food Insecurity (11/13/2023)   Hunger Vital Sign    Worried About Running Out of Food in the Last Year: Never true    Ran Out of Food in the Last Year: Never true  Transportation Needs: Not on file  Physical Activity: Not on file  Stress:  Not on file  Social Connections: Not on file    Family History: Family History  Problem Relation Age of Onset   Hypertension Mother    Diabetes Father    Hypertension Father     Allergies: No Known Allergies  Medications Prior to Admission  Medication Sig Dispense Refill Last Dose/Taking   ascorbic acid (VITAMIN C) 500 MG tablet Take 1 tablet (500 mg total) by mouth every other day. Take with iron pill (Patient not taking: Reported on 11/16/2023) 30 tablet 3 Taking Differently   aspirin EC 81 MG tablet Take 1 tablet (81 mg total) by mouth daily. Start taking when you are [redacted] weeks pregnant for rest of pregnancy for prevention of preeclampsia 300 tablet 2 11/17/2023   cetirizine (ZYRTEC ALLERGY) 10 MG tablet Take 1 tablet (10 mg total) by mouth daily. 30 tablet 12 Taking   ferrous sulfate 325 (65 FE) MG EC tablet Take 1 tablet (325 mg total) by mouth every other day. (Patient not taking: Reported on 11/16/2023) 30 tablet 3 Taking Differently   Prenat-Fe Carbonyl-FA-Omega 3 (ONE-A-DAY WOMENS PRENATAL 1) 28-0.8-235 MG CAPS Take 1 capsule by mouth  every evening.   Taking   Accu-Chek Softclix Lancets lancets Use as instructed 100 each 12    Blood Glucose Monitoring Suppl (ACCU-CHEK GUIDE) w/Device KIT 1 each by Does not apply route 4 (four) times daily. 1 kit 0    Blood Pressure Monitoring (BLOOD PRESSURE KIT) DEVI 1 kit by Does not apply route as needed. 1 each 0    glucose blood test strip Use to check blood sugar fasting and 2 hours postprandial (after meals) 200 each 12      Review of Systems   All systems reviewed and negative except as stated in HPI  Temperature 97.8 F (36.6 C), temperature source Oral, height 5\' 4"  (1.626 m), weight 92.4 kg, last menstrual period 02/17/2023, not currently breastfeeding. General appearance: alert, cooperative, and appears stated age Lungs: clear to auscultation bilaterally Heart: regular rate and rhythm Abdomen: soft, non-tender; bowel sounds  normal Extremities: No calf swelling or tenderness Presentation: cephalic Fetal monitoring: 150s Uterine activity: quiet     Prenatal labs: ABO, Rh: --/--/O POS (03/27 0940) Antibody: NEG (03/27 0940) Rubella: 2.12 (09/09 1115) RPR: NON REACTIVE (03/27 0935)  HBsAg: Negative (09/09 1115)  HIV: Non Reactive (01/16 0904)  GBS: Positive/-- (03/06 1637)  2 hr Glucola: abnormal Genetic screening:  wnl Anatomy US: wnl  Prenatal Transfer Tool  Maternal Diabetes: Yes:  Diabetes Type:  Diet controlled Genetic Screening: Normal Maternal Ultrasounds/Referrals: Normal Fetal Ultrasounds or other Referrals:  None Maternal Substance Abuse:  No Significant Maternal Medications:  None Significant Maternal Lab Results: Group B Strep positive  Results for orders placed or performed during the hospital encounter of 11/18/23 (from the past 24 hours)  Glucose, capillary   Collection Time: 11/18/23  9:04 AM  Result Value Ref Range   Glucose-Capillary 70 70 - 99 mg/dL  Results for orders placed or performed during the hospital encounter of 11/17/23 (from the past 24 hours)  CBC   Collection Time: 11/17/23  9:35 AM  Result Value Ref Range   WBC 6.9 4.0 - 10.5 K/uL   RBC 3.77 (L) 3.87 - 5.11 MIL/uL   Hemoglobin 10.1 (L) 12.0 - 15.0 g/dL   HCT 01.0 (L) 27.2 - 53.6 %   MCV 77.5 (L) 80.0 - 100.0 fL   MCH 26.8 26.0 - 34.0 pg   MCHC 34.6 30.0 - 36.0 g/dL   RDW 64.4 03.4 - 74.2 %   Platelets 215 150 - 400 K/uL   nRBC 0.0 0.0 - 0.2 %  RPR   Collection Time: 11/17/23  9:35 AM  Result Value Ref Range   RPR Ser Ql NON REACTIVE NON REACTIVE  Type and screen   Collection Time: 11/17/23  9:40 AM  Result Value Ref Range   ABO/RH(D) O POS    Antibody Screen NEG    Sample Expiration      11/20/2023,2359 Performed at [facility], [address], [place], [place] [address]     Patient Active Problem List   Diagnosis Date Noted   Gestational diabetes mellitus in pregnancy, diet controlled  09/29/2023   History of gestational hypertension 07/13/2023   H/O: C-section 07/13/2023   AMA (advanced maternal age) multigravida 35+, unspecified trimester 06/28/2023   Obesity affecting pregnancy 06/28/2023   Supervision of high risk pregnancy, antepartum 05/02/2023   Alpha thalassemia silent carrier 07/10/2021   Sickle cell trait (HCC) 06/17/2021   Uterine fibroids affecting pregnancy 06/17/2021   Anemia affecting pregnancy in third trimester 05/06/2021    Assessment: Caitlin Gutierrez  is a 38 y.o. G2P1001 at [redacted]w[redacted]d here for scheduled repeat cesarean section. Offered TOLAC and risks/benefits of TOLAC vs scheduled repeat cesarean were reviewed with the patient. She affirms her desire to proceed with scheduled repeat cesarean.  The risks of cesarean section were discussed with the patient including but were not limited to: bleeding which may require transfusion or reoperation; infection which may require antibiotics; injury to bowel, bladder, ureters or other surrounding organs; injury to the fetus; need for additional procedures including hysterectomy in the event of a life-threatening hemorrhage; placental abnormalities wth subsequent pregnancies, incisional problems, thromboembolic phenomenon and other postoperative/anesthesia complications.  She declines permanent sterilization. Patient has been NPO since midnight she will remain NPO for procedure. Anesthesia and OR aware.  Preoperative prophylactic antibiotics and SCDs ordered on call to the OR.  To OR when ready.  # Fibroid uterus: anterior fundal and left lateral intramural  # A1GDM: well controlled, normal growth last check  # Anemia: mild, hgb 10.1  # Contraception: interval iud  # Feeding: breast  # Circ: n/a    Cherrie Gauze Chanie Soucek 11/18/2023, 9:16 AM

## 2023-11-19 LAB — CBC
HCT: 26.7 % — ABNORMAL LOW (ref 36.0–46.0)
Hemoglobin: 9.4 g/dL — ABNORMAL LOW (ref 12.0–15.0)
MCH: 27.5 pg (ref 26.0–34.0)
MCHC: 35.2 g/dL (ref 30.0–36.0)
MCV: 78.1 fL — ABNORMAL LOW (ref 80.0–100.0)
Platelets: 181 10*3/uL (ref 150–400)
RBC: 3.42 MIL/uL — ABNORMAL LOW (ref 3.87–5.11)
RDW: 14.3 % (ref 11.5–15.5)
WBC: 13.3 10*3/uL — ABNORMAL HIGH (ref 4.0–10.5)
nRBC: 0 % (ref 0.0–0.2)

## 2023-11-19 LAB — RESPIRATORY PANEL BY PCR

## 2023-11-19 LAB — COMPREHENSIVE METABOLIC PANEL WITH GFR
ALT: 11 U/L (ref 0–44)
AST: 26 U/L (ref 15–41)
Albumin: 2.6 g/dL — ABNORMAL LOW (ref 3.5–5.0)
Alkaline Phosphatase: 94 U/L (ref 38–126)
Anion gap: 9 (ref 5–15)
BUN: <5 mg/dL — ABNORMAL LOW (ref 6–20)
CO2: 22 mmol/L (ref 22–32)
Calcium: 9.2 mg/dL (ref 8.9–10.3)
Chloride: 105 mmol/L (ref 98–111)
Creatinine, Ser: 0.56 mg/dL (ref 0.44–1.00)
GFR, Estimated: >60 mL/min (ref >60)
Glucose, Bld: 114 mg/dL — ABNORMAL HIGH (ref 70–99)
Potassium: 3.8 mmol/L (ref 3.5–5.1)
Sodium: 136 mmol/L (ref 135–145)
Total Bilirubin: 0.6 mg/dL (ref 0.0–1.2)
Total Protein: 5.7 g/dL — ABNORMAL LOW (ref 6.5–8.1)

## 2023-11-19 MED ORDER — OXYCODONE HCL 5 MG PO TABS
5.0000 mg | ORAL_TABLET | Freq: Once | ORAL | Status: AC
  Administered 2023-11-19: 5 mg via ORAL
  Filled 2023-11-19: qty 1

## 2023-11-19 MED ORDER — OXYCODONE HCL 5 MG PO TABS
10.0000 mg | ORAL_TABLET | ORAL | Status: DC | PRN
  Administered 2023-11-20: 10 mg via ORAL
  Filled 2023-11-19: qty 2

## 2023-11-19 MED ORDER — DEXTROMETHORPHAN POLISTIREX ER 30 MG/5ML PO SUER
30.0000 mg | Freq: Two times a day (BID) | ORAL | Status: DC
  Administered 2023-11-19 – 2023-11-20 (×2): 30 mg via ORAL
  Filled 2023-11-19 (×4): qty 5

## 2023-11-19 NOTE — Progress Notes (Signed)
 POSTPARTUM PROGRESS NOTE  POD #1  Subjective:  Caitlin Gutierrez is a 38 y.o. [id number] s/p repeat LTCS at [redacted]w[redacted]d.  No acute events overnight. She reports she is doing well. She denies any problems with ambulating, voiding or po intake. Denies nausea or vomiting. She has not yet passed flatus. Pain is well controlled.  Lochia is minimal.  Objective: BP 122/78   Pulse 78   Temp 98.2 F (36.8 C) (Oral)   Resp 18   Ht 5\' 4"  (1.626 m)   Wt 92.4 kg   LMP 02/17/2023 (Exact Date)   SpO2 100%   Breastfeeding Unknown   BMI 34.95 kg/m   Physical Exam:  General: alert, cooperative and no distress Chest: no respiratory distress Heart:regular rate, distal pulses intact Abdomen: soft, nontender,  Uterine Fundus: firm, appropriately tender DVT Evaluation: No calf swelling or tenderness Extremities: no LE edema Skin: warm, dry; incision clean/dry/intact w/ pressure dressing in place  Recent Labs    11/18/23 1450 11/19/23 0452  HGB 10.3* 9.4*  HCT 29.9* 26.7*    Assessment/Plan: Caitlin Gutierrez is a 38 y.o. [id number] s/p repeat LTCS at [redacted]w[redacted]d for: elective.  POD#1 - Doing welll; pain well controlled. H/H appropriate  Routine postpartum care  OOB, ambulated  Lovenox for VTE prophylaxis Anemia/PPH: Total QBL 1940 cc due to extensions. Asymptomatic, s/p 2u pRBC, hgb to 9.4 this AM PP HTN: normotensive with no meds, on lasix and K. No CMP on file, will obtain this AM.  Contraception: plans outpatient IUD Feeding: breast GDMA1: follow up glucose on CMP Dispo: Plan for discharge POD#2-3 pending clinical course.   LOS: 1 day   Caitlin Maples, MD/MPH Attending Family Medicine Physician, [facility] for [facility], [facility]  11/19/2023, 10:17 AM

## 2023-11-19 NOTE — Lactation Note (Signed)
 This note was copied from a baby's chart. Lactation Consultation Note  Patient Name: Caitlin Gutierrez [id number] Date: 11/19/2023 Age:37 hours Reason for consult: Follow-up assessment;Term;Maternal endocrine disorder  P2 mom of 21 hour old infant consulted. LC reviewed breastfeeding progress and mom reports adequate breastfeeds and denies concerns. Support person states that baby is fed every 3-4 hours. LC recommended striving for feeding baby within 3 hours of previous feed and to not wait until 3 hours if infant is showing cues. Support stated that she was informed to wait until 3 hours. LC reviewed infant stomach size growth and cluster feeding. Baby should strive for 8-12 feedings per 24 hours but expect her to cluster and likely show interest in feeding more after 24 hours of age.    Provided manual pump due to mom supplementing with formula to maintain adequate breast stimulation and encourage milk production. Provided hand outs for milk storage and cleaning. Mom used pump with LC observing. Colostrum expresses well. Discussed to use for 10 minutes on each breast when infant does not latch or after feeding for additional milk removal as needed.    Encouraged to call with any questions or need of assistance.      Maternal Data    Feeding Mother's Current Feeding Choice: Breast Milk and Formula Nipple Type: Slow - flow  LATCH Score   Infant asleep in support person's arms at [facility] entry.     Lactation Tools Discussed/Used Tools: Pump;Flanges Flange Size: 21 Breast pump type: Manual Pump Education: Milk Storage;Setup, frequency, and cleaning Reason for Pumping: supplementation; breast stimulation Pumping frequency: when not nursing or when supplementation  Interventions Interventions: Breast feeding basics reviewed;Coconut oil;Hand pump;Education;CDC milk storage guidelines;CDC Guidelines for Breast Pump Cleaning  Discharge    Consult Status Consult Status: Follow-up Date:  11/20/23 Follow-up type: In-patient    Caitlin Gutierrez 11/19/2023, 9:42 AM

## 2023-11-19 NOTE — Progress Notes (Signed)
 Patient states she had a cough prior to delivery and is having a productive cough with some yellow sputum now. Patient requested cough medication. Called first call, Lamont Snowball, CNM, who states she will place some orders for medication and a swab. Earl Gala, Linda Hedges [place]

## 2023-11-20 MED ORDER — FUROSEMIDE 40 MG PO TABS: 40.0000 mg | ORAL_TABLET | Freq: Every day | ORAL | 0 refills | Status: DC

## 2023-11-20 MED ORDER — POTASSIUM CHLORIDE CRYS ER 20 MEQ PO TBCR: 40.0000 meq | EXTENDED_RELEASE_TABLET | Freq: Every day | ORAL | 0 refills | Status: AC

## 2023-11-20 MED ORDER — OXYCODONE HCL 5 MG PO TABS: 5.0000 mg | ORAL_TABLET | Freq: Four times a day (QID) | ORAL | 0 refills | Status: AC | PRN

## 2023-11-20 MED ORDER — SENNOSIDES-DOCUSATE SODIUM 8.6-50 MG PO TABS: 2.0000 | ORAL_TABLET | ORAL | 0 refills | Status: AC

## 2023-11-20 MED ORDER — IBUPROFEN 600 MG PO TABS: 600.0000 mg | ORAL_TABLET | Freq: Four times a day (QID) | ORAL | 0 refills | Status: AC

## 2023-11-20 NOTE — Progress Notes (Signed)
 Babyscripts hypertension protocol set up per MD order. Blood pressure visible in episodes of care. Discharge instructions reviewed with patient. Patient has no further questions or concerns. Patient ready for discharge. Earl Gala, Linda Hedges [place]

## 2023-11-20 NOTE — Lactation Note (Signed)
 This note was copied from a baby's chart. Lactation Consultation Note  Patient Name: Caitlin Gutierrez [id number] Date: 11/20/2023 Age:37 hours, P2 , experienced breast feeder.  Reason for consult: Follow-up assessment;Term;Infant weight loss;Maternal endocrine disorder (4 % weight loss) Per mom plans to breast and formula feed, just like she did with her 1st baby and she had plenty of milk.  LC reviewed the importance of the 1st 2 weeks of breast feeding to establish her milk supply.  LC recommended  offering the breast 1st and if the baby is still hungry offer the 2nd breast , if satisfied hold off on the formula. If not keep it 30 ml and below.  LC reviewed prevention and tx of engorgement and the Breast feeding D/C information.   Maternal Data    Feeding Mother's Current Feeding Choice: Breast Milk and Formula  LATCH Score - LC unable to check the latch at this consult, baby recently fed and mom aware she can can for a latch assessment.     Lactation Tools Discussed/Used Tools: Pump;Flanges Flange Size: 21 Breast pump type: Manual Pump Education: Milk Storage;Setup, frequency, and cleaning;Other (comment) (per mom has been using the hand pump) Reason for Pumping: PRN  Interventions Interventions: Breast feeding basics reviewed;Coconut oil;Hand pump;Education;LC Services brochure;CDC Guidelines for Breast Pump Cleaning;CDC milk storage guidelines  Discharge Discharge Education: Engorgement and breast care;Warning signs for feeding baby Pump: DEBP;Personal;Manual  Consult Status Consult Status: Complete Date: 11/20/23    Caitlin Gutierrez 11/20/2023, 10:28 AM

## 2023-11-20 NOTE — Progress Notes (Signed)
 POSTPARTUM PROGRESS NOTE  Subjective: Caitlin Gutierrez is a 38 y.o. G2P2002 s/p LTCS at [redacted]w[redacted]d.  She reports she doing well. No acute events overnight. She denies any problems with ambulating, voiding or po intake. Denies nausea or vomiting. She has passed flatus. Pain is well controlled.  Lochia is minimal.  Objective: Blood pressure 117/65, pulse 80, temperature 98.1 F (36.7 C), temperature source Oral, resp. rate 18, height 5\' 4"  (1.626 m), weight 92.4 kg, last menstrual period 02/17/2023, SpO2 99%, unknown if currently breastfeeding.  Physical Exam:  General: alert, cooperative and no distress Chest: no respiratory distress Abdomen: soft, non-tender,  incision dressing C/D/I  Uterine Fundus: firm and at level of umbilicus Extremities: No calf swelling or tenderness  No edema  Recent Labs    11/18/23 1450 11/19/23 0452  HGB 10.3* 9.4*  HCT 29.9* 26.7*    Assessment/Plan: Caitlin Gutierrez is a 38 y.o. G2P2002 s/p LTCS at [redacted]w[redacted]d for elective CS.  Patient is doing well, no concerns at this time, feels ready to discharge.   Routine Postpartum Care: Doing well, pain well-controlled.  Glucose 81.  - Continue routine care, lactation support  - Contraception: OP IUD - Feeding: BF - Anemia/PPH: Total QBL 1940 cc. Asymptomatic, s/p 2u pRBC, Hgb 9.4 yesterday - PP HTN: normotensive with no meds, on lasix and K - A1GDM: Glucose 114 yesterday on CMP.  Dispo: Plan for discharge today.  Fortunato Curling, DO [facility], PGY-1 11/20/2023 10:26 AM   Attestation of Supervision of Resident:  I confirm that I have verified the information documented in the resident's note and that I have also personally reperformed the history, physical exam and all medical decision making activities.  I have verified that all services and findings are accurately documented in this resident's note; and I agree with management and plan as outlined in the documentation. I have also made any necessary  editorial changes.   Sundra Aland, MD OB Fellow, [facility], Center for [facility]

## 2023-11-21 LAB — TYPE AND SCREEN
ABO/RH(D): O POS
Antibody Screen: NEGATIVE
Unit division: 0
Unit division: 0
Unit division: 0
Unit division: 0

## 2023-11-21 LAB — BPAM RBC
Blood Product Expiration Date: 202504262359
Blood Product Expiration Date: 202504262359
Blood Product Expiration Date: 202504272359
Blood Product Expiration Date: 202504272359
ISSUE DATE / TIME: 202503281110
ISSUE DATE / TIME: 202503281110
Unit Type and Rh: 5100
Unit Type and Rh: 5100
Unit Type and Rh: 5100
Unit Type and Rh: 5100

## 2023-11-25 ENCOUNTER — Telehealth: Payer: Self-pay

## 2023-11-25 NOTE — Telephone Encounter (Signed)
 S/w pt after babyscripts alert for BP 144/86. Pt states that she is still in bed and will take BP later and log it. Pt states that she was holding her baby and moving when she took the last reading.

## 2023-11-26 ENCOUNTER — Other Ambulatory Visit: Payer: Self-pay | Admitting: Obstetrics and Gynecology

## 2023-11-28 ENCOUNTER — Ambulatory Visit: Admitting: *Deleted

## 2023-11-28 ENCOUNTER — Encounter: Payer: Self-pay | Admitting: Obstetrics and Gynecology

## 2023-11-28 VITALS — BP 134/87 | HR 75 | Wt 185.0 lb

## 2023-11-28 DIAGNOSIS — Z98891 History of uterine scar from previous surgery: Secondary | ICD-10-CM

## 2023-11-28 NOTE — Progress Notes (Signed)
 Subjective:     Caitlin Gutierrez is a 38 y.o. female who presents to the clinic 1 weeks status post classical cesarean section. Pt reports incision is healing well.      Objective:    LMP 02/17/2023 (Exact Date)  General:  alert, well appearing, in no apparent distress, oriented to person, place and time  Incision:   healing well, no drainage, no erythema, no hernia, no seroma, no swelling, well approximated, no dehiscence, incision well approximated     Assessment:    Doing well postoperatively.   Plan:    1. Continue any current medications. 2. Wound care discussed. 3. Follow up: as scheduled for postpartum appt.   Harrel Lemon, RN  Doing well over all. Denies symptoms of depression or anxiety. VB has slowed. Denies pain. Reports sleep is "ok". Adjusting to newborn. Partner is supportive.

## 2023-12-30 ENCOUNTER — Other Ambulatory Visit

## 2023-12-30 ENCOUNTER — Ambulatory Visit: Admitting: Obstetrics & Gynecology

## 2023-12-30 ENCOUNTER — Encounter: Payer: Self-pay | Admitting: Obstetrics & Gynecology

## 2023-12-30 DIAGNOSIS — Z30430 Encounter for insertion of intrauterine contraceptive device: Secondary | ICD-10-CM

## 2023-12-30 DIAGNOSIS — Z3202 Encounter for pregnancy test, result negative: Secondary | ICD-10-CM | POA: Diagnosis not present

## 2023-12-30 LAB — POCT URINE PREGNANCY: Preg Test, Ur: NEGATIVE

## 2023-12-30 MED ORDER — LEVONORGESTREL 20 MCG/DAY IU IUD
1.0000 | INTRAUTERINE_SYSTEM | Freq: Once | INTRAUTERINE | Status: AC
  Administered 2023-12-30: 1 via INTRAUTERINE

## 2023-12-30 NOTE — Progress Notes (Signed)
 UPT  Negative   Administrations This Visit     levonorgestrel (MIRENA) 20 MCG/DAY IUD 1 each     Admin Date 12/30/2023 Action Given Dose 1 each Route Intrauterine Documented By Mancel Seashore, RMA

## 2023-12-30 NOTE — Progress Notes (Signed)
 Patient ID: Caitlin Gutierrez, female   DOB: 09-22-1985, 38 y.o.   MRN: [id number]    GYNECOLOGY OFFICE PROCEDURE NOTE  [name] is a 39 y.o. G2P2002 here for Mirena IUD insertion. No GYN concerns.  Last pap smear was on 04/2021 and was normal.  IUD Insertion Procedure Note Patient identified, informed consent performed, consent signed.   Discussed risks of irregular bleeding, cramping, infection, malpositioning or misplacement of the IUD outside the uterus which may require further procedure such as laparoscopy. Also discussed >99% contraception efficacy, increased risk of ectopic pregnancy with failure of method.   Emphasized that this did not protect against STIs, condoms recommended during all sexual encounters. Time out was performed.  Chaperone present.  Urine pregnancy test negative.  Speculum placed in the vagina.  Cervix visualized.  Cleaned with Betadine  x 2.  Grasped anteriorly with a single tooth tenaculum.  Uterus sounded to 8 cm.  Mirena IUD placed per manufacturer's recommendations.  Strings trimmed to 3 cm. Tenaculum was removed, good hemostasis noted.  Patient tolerated procedure well.   Patient was given post-procedure instructions.  She was advised to have backup contraception for one week.  Patient was also asked to check IUD strings periodically and follow up in 4 weeks for IUD check.  [name], MD Obstetrician & Gynecologist, [facility] for [facility], [facility]

## 2023-12-30 NOTE — Progress Notes (Signed)
 Post Partum Visit Note  Caitlin Gutierrez is a 38 y.o. G63P2002 female who presents for a postpartum visit. She is 6 weeks postpartum following a repeat cesarean section.  I have fully reviewed the prenatal and intrapartum course. The delivery was at 39.4 gestational weeks.  Anesthesia: epidural. Postpartum course has been unremarkable. Baby is doing well. Baby is feeding by breast. Bleeding no bleeding. Bowel function is normal. Bladder function is normal. Patient is not sexually active. Contraception method is IUD. Postpartum depression screening: negative.   Upstream - 12/30/23 2841       Pregnancy Intention Screening   Does the patient want to become pregnant in the next year? No    Does the patient's partner want to become pregnant in the next year? No    Would the patient like to discuss contraceptive options today? Yes            The pregnancy intention screening data noted above was reviewed. Potential methods of contraception were discussed. The patient elected to proceed with No data recorded.   Edinburgh Postnatal Depression Scale - 12/30/23 0851       Edinburgh Postnatal Depression Scale:  In the Past 7 Days   I have been able to laugh and see the funny side of things. 0    I have looked forward with enjoyment to things. 0    I have blamed myself unnecessarily when things went wrong. 0    I have been anxious or worried for no good reason. 0    I have felt scared or panicky for no good reason. 0    Things have been getting on top of me. 0    I have been so unhappy that I have had difficulty sleeping. 0    I have felt sad or miserable. 0    I have been so unhappy that I have been crying. 0    The thought of harming myself has occurred to me. 0    Edinburgh Postnatal Depression Scale Total 0             Health Maintenance Due  Topic Date Due   COVID-19 Vaccine (3 - 2024-25 season) 04/24/2023    The following portions of the patient's history were reviewed and  updated as appropriate: allergies, current medications, past family history, past medical history, past social history, past surgical history, and problem list.  Review of Systems Pertinent items are noted in HPI.  Objective:  BP 126/86 (BP Location: Right Arm, Cuff Size: Normal)   Pulse 80   Ht 5\' 3"  (1.6 m)   Wt 187 lb (84.8 kg)   LMP 02/17/2023 (Exact Date)   Breastfeeding Yes   BMI 33.13 kg/m    General:  alert, cooperative, and no distress   Breasts:  not indicated  Lungs:   Heart:  regular rate and rhythm  Abdomen: soft, non-tender; bowel sounds normal; no masses,  no organomegaly   Wound well approximated incision  GU exam:  normal       Assessment:    1. Postpartum care and examination [Z39.2] Doing well and requests Mirena   normal postpartum exam.   Plan:   Essential components of care per ACOG recommendations:  1.  Mood and well being: Patient with negative depression screening today. Reviewed local resources for support.  - Patient tobacco use? No.   - hx of drug use? No.    2. Infant care and feeding:  -Patient currently breastmilk feeding? Yes.  Discussed returning to work and pumping.  -Social determinants of health (SDOH) reviewed in EPIC. No concerns 3. Sexuality, contraception and birth spacing - Patient does not want a pregnancy in the next year.  Desired family size is 2 children.  - Reviewed reproductive life planning. Reviewed contraceptive methods based on pt preferences and effectiveness.  Patient desired IUD or IUS today.   - Discussed birth spacing of 18 months  4. Sleep and fatigue -Encouraged family/partner/community support of 4 hrs of uninterrupted sleep to help with mood and fatigue  5. Physical Recovery  - Discussed patients delivery and complications. She describes her admission as good. - Patient had a C-section repeat; no problems after deliver.- Patient has urinary incontinence? No. - Patient is safe to resume physical and sexual  activity  6.  Health Maintenance - HM due items addressed Yes - Last pap smear  Diagnosis  Date Value Ref Range Status  05/04/2021   Final   - Negative for intraepithelial lesion or malignancy (NILM)   Pap smear not done at today's visit.  -Breast Cancer screening indicated? No.   7. Chronic Disease/Pregnancy Condition follow up: None  - PCP follow up  [name], MD  Center for [facility], [facility]

## 2023-12-30 NOTE — Patient Instructions (Signed)
 Caitlin Gutierrez

## 2024-01-04 ENCOUNTER — Other Ambulatory Visit

## 2024-01-04 DIAGNOSIS — O24419 Gestational diabetes mellitus in pregnancy, unspecified control: Secondary | ICD-10-CM

## 2024-01-05 LAB — GLUCOSE TOLERANCE, 2 HOURS
Glucose, 2 hour: 118 mg/dL (ref 70–139)
Glucose, GTT - Fasting: 84 mg/dL (ref 70–99)

## 2024-01-11 ENCOUNTER — Ambulatory Visit: Payer: Self-pay | Admitting: Obstetrics and Gynecology

## 2024-05-23 ENCOUNTER — Ambulatory Visit: Admitting: Obstetrics

## 2024-05-31 ENCOUNTER — Ambulatory Visit: Admitting: Obstetrics

## 2024-08-31 ENCOUNTER — Other Ambulatory Visit: Payer: Self-pay

## 2024-08-31 ENCOUNTER — Encounter (HOSPITAL_COMMUNITY): Payer: Self-pay | Admitting: *Deleted

## 2024-08-31 ENCOUNTER — Emergency Department (HOSPITAL_COMMUNITY)

## 2024-08-31 ENCOUNTER — Emergency Department (HOSPITAL_COMMUNITY)
Admission: EM | Admit: 2024-08-31 | Discharge: 2024-08-31 | Discharge status: Against Medical Advice | Attending: Emergency Medicine | Admitting: Emergency Medicine

## 2024-08-31 DIAGNOSIS — J029 Acute pharyngitis, unspecified: Secondary | ICD-10-CM | POA: Insufficient documentation

## 2024-08-31 DIAGNOSIS — Z5321 Procedure and treatment not carried out due to patient leaving prior to being seen by health care provider: Secondary | ICD-10-CM | POA: Diagnosis not present

## 2024-08-31 LAB — BASIC METABOLIC PANEL WITH GFR
Anion gap: 12 (ref 5–15)
BUN: 12 mg/dL (ref 6–20)
CO2: 24 mmol/L (ref 22–32)
Calcium: 9.8 mg/dL (ref 8.9–10.3)
Chloride: 105 mmol/L (ref 98–111)
Creatinine, Ser: 0.62 mg/dL (ref 0.44–1.00)
GFR, Estimated: >60 mL/min
Glucose, Bld: 124 mg/dL — ABNORMAL HIGH (ref 70–99)
Potassium: 3.5 mmol/L (ref 3.5–5.1)
Sodium: 140 mmol/L (ref 135–145)

## 2024-08-31 LAB — CBC WITH DIFFERENTIAL/PLATELET
Abs Immature Granulocytes: 0.01 K/uL (ref 0.00–0.07)
Basophils Absolute: 0 K/uL (ref 0.0–0.1)
Basophils Relative: 0 %
Eosinophils Absolute: 0.2 K/uL (ref 0.0–0.5)
Eosinophils Relative: 4 %
HCT: 36.9 % (ref 36.0–46.0)
Hemoglobin: 12.3 g/dL (ref 12.0–15.0)
Immature Granulocytes: 0 %
Lymphocytes Relative: 40 %
Lymphs Abs: 2.4 K/uL (ref 0.7–4.0)
MCH: 25.8 pg — ABNORMAL LOW (ref 26.0–34.0)
MCHC: 33.3 g/dL (ref 30.0–36.0)
MCV: 77.4 fL — ABNORMAL LOW (ref 80.0–100.0)
Monocytes Absolute: 0.5 K/uL (ref 0.1–1.0)
Monocytes Relative: 9 %
Neutro Abs: 2.8 K/uL (ref 1.7–7.7)
Neutrophils Relative %: 47 %
Platelets: 344 K/uL (ref 150–400)
RBC: 4.77 MIL/uL (ref 3.87–5.11)
RDW: 13.5 % (ref 11.5–15.5)
WBC: 6 K/uL (ref 4.0–10.5)
nRBC: 0 % (ref 0.0–0.2)

## 2024-08-31 LAB — TSH: TSH: 0.703 u[IU]/mL (ref 0.350–4.500)

## 2024-08-31 LAB — T4, FREE: Free T4: 1.11 ng/dL (ref 0.80–2.00)

## 2024-08-31 NOTE — ED Provider Triage Note (Signed)
 Emergency Medicine Provider Triage Evaluation Note  Caitlin Gutierrez , a 39 y.o. female  was evaluated in triage.  Pt complains of thyroid  area swelling, recently got MMR and polio vaccine 3 days ago.  Review of Systems  Positive: Swelling Negative: Fever, chill, nausea, vomiting, shortness of breath, chest pain  Physical Exam  BP (!) 143/86 (BP Location: Right Arm)   Pulse 97   Temp 99.1 F (37.3 C)   Resp 15   SpO2 96%  Gen:   Awake, no distress   Resp:  Normal effort  MSK:   Moves extremities without difficulty  Other:  Patient appears to have small goiter she is tender to palpation  Medical Decision Making  Medically screening exam initiated at 3:59 PM.  Appropriate orders placed.  Caitlin Gutierrez was informed that the remainder of the evaluation will be completed by another provider, this initial triage assessment does not replace that evaluation, and the importance of remaining in the ED until their evaluation is complete.  Labs and imaging ordered   Caitlin Gutierrez 08/31/24 [id number]

## 2024-08-31 NOTE — ED Notes (Signed)
 Pt LWBS after triage, pt made sort staff aware before departure.

## 2024-08-31 NOTE — ED Triage Notes (Signed)
 The pt had to take 3 injections 3 days ago  the pa has thenames of them  lmp iud

## 2024-09-01 LAB — T3: T3, Total: 94 ng/dL (ref 71–180)
